# Patient Record
Sex: Female | Born: 1990 | Race: White | Hispanic: No | Marital: Single | Smoking: Current every day smoker
Health system: Southern US, Community
[De-identification: ages and names within clinical notes are randomized; demographics above are authoritative.]

## PROBLEM LIST (undated history)

## (undated) DIAGNOSIS — Z789 Other specified health status: Secondary | ICD-10-CM

## (undated) HISTORY — DX: Other specified health status: Z78.9

## (undated) HISTORY — PX: WISDOM TOOTH EXTRACTION: SHX21

---

## 2012-06-30 ENCOUNTER — Other Ambulatory Visit: Payer: Self-pay | Admitting: Specialist

## 2012-06-30 DIAGNOSIS — N63 Unspecified lump in unspecified breast: Secondary | ICD-10-CM

## 2012-07-10 ENCOUNTER — Ambulatory Visit: Payer: Managed Care, Other (non HMO) | Attending: Specialist | Admitting: Physical Therapy

## 2012-07-10 DIAGNOSIS — R293 Abnormal posture: Secondary | ICD-10-CM | POA: Insufficient documentation

## 2012-07-10 DIAGNOSIS — M545 Low back pain, unspecified: Secondary | ICD-10-CM | POA: Insufficient documentation

## 2012-07-10 DIAGNOSIS — M25659 Stiffness of unspecified hip, not elsewhere classified: Secondary | ICD-10-CM | POA: Insufficient documentation

## 2012-07-10 DIAGNOSIS — IMO0001 Reserved for inherently not codable concepts without codable children: Secondary | ICD-10-CM | POA: Insufficient documentation

## 2012-07-14 ENCOUNTER — Ambulatory Visit
Admission: RE | Admit: 2012-07-14 | Discharge: 2012-07-14 | Disposition: A | Discharge status: Home / Self Care | Payer: Managed Care, Other (non HMO) | Source: Ambulatory Visit | Attending: Specialist | Admitting: Specialist

## 2012-07-14 DIAGNOSIS — N63 Unspecified lump in unspecified breast: Secondary | ICD-10-CM

## 2012-07-16 ENCOUNTER — Encounter: Payer: Managed Care, Other (non HMO) | Admitting: Rehabilitation

## 2012-07-18 ENCOUNTER — Ambulatory Visit: Payer: Managed Care, Other (non HMO) | Admitting: Physical Therapy

## 2012-07-21 ENCOUNTER — Ambulatory Visit: Payer: Managed Care, Other (non HMO) | Admitting: Rehabilitation

## 2012-07-23 ENCOUNTER — Encounter: Payer: Managed Care, Other (non HMO) | Admitting: Physical Therapy

## 2012-08-05 ENCOUNTER — Encounter: Payer: Managed Care, Other (non HMO) | Admitting: Rehabilitation

## 2012-08-07 ENCOUNTER — Encounter: Payer: Managed Care, Other (non HMO) | Admitting: Physical Therapy

## 2012-12-06 ENCOUNTER — Emergency Department (HOSPITAL_COMMUNITY)
Admission: EM | Admit: 2012-12-06 | Discharge: 2012-12-06 | Disposition: A | Discharge status: Home / Self Care | Payer: Managed Care, Other (non HMO) | Attending: Emergency Medicine | Admitting: Emergency Medicine

## 2012-12-06 ENCOUNTER — Emergency Department (HOSPITAL_COMMUNITY): Payer: Managed Care, Other (non HMO)

## 2012-12-06 ENCOUNTER — Encounter (HOSPITAL_COMMUNITY): Payer: Self-pay | Admitting: Emergency Medicine

## 2012-12-06 DIAGNOSIS — Y92009 Unspecified place in unspecified non-institutional (private) residence as the place of occurrence of the external cause: Secondary | ICD-10-CM | POA: Insufficient documentation

## 2012-12-06 DIAGNOSIS — Y9389 Activity, other specified: Secondary | ICD-10-CM | POA: Insufficient documentation

## 2012-12-06 DIAGNOSIS — IMO0002 Reserved for concepts with insufficient information to code with codable children: Secondary | ICD-10-CM | POA: Insufficient documentation

## 2012-12-06 DIAGNOSIS — Z23 Encounter for immunization: Secondary | ICD-10-CM | POA: Insufficient documentation

## 2012-12-06 DIAGNOSIS — F17200 Nicotine dependence, unspecified, uncomplicated: Secondary | ICD-10-CM | POA: Insufficient documentation

## 2012-12-06 DIAGNOSIS — S51809A Unspecified open wound of unspecified forearm, initial encounter: Secondary | ICD-10-CM | POA: Insufficient documentation

## 2012-12-06 MED ORDER — OXYCODONE-ACETAMINOPHEN 5-325 MG PO TABS
1.0000 | ORAL_TABLET | Freq: Once | ORAL | Status: AC
  Administered 2012-12-06: 1 via ORAL
  Filled 2012-12-06: qty 1

## 2012-12-06 MED ORDER — TETANUS-DIPHTH-ACELL PERTUSSIS 5-2.5-18.5 LF-MCG/0.5 IM SUSP
0.5000 mL | Freq: Once | INTRAMUSCULAR | Status: AC
  Administered 2012-12-06: 0.5 mL via INTRAMUSCULAR
  Filled 2012-12-06: qty 0.5

## 2012-12-06 MED ORDER — NAPROXEN 500 MG PO TABS: 500.0000 mg | ORAL_TABLET | Freq: Two times a day (BID) | ORAL | Status: DC

## 2012-12-06 MED ORDER — OXYCODONE-ACETAMINOPHEN 5-325 MG PO TABS: 1.0000 | ORAL_TABLET | ORAL | Status: DC | PRN

## 2012-12-06 NOTE — ED Provider Notes (Signed)
22 year old female presents with laceration to the right hand after she punched her hand through glass. This was acute in onset just prior to arrival, the symptoms are persistent, mild to moderate, worse with range of motion. She is unsure of her last tetanus shot.  Patient has mild tenderness over the metacarpophalangeal joints of all 5 fingers except for the thumb. She has small abrasions and superficial lacerations which are fillet type injuries to the hand. None of these require repair. There is a very small laceration that does require repair which is linear and subcutaneous and located over the dorsal ulnar aspect of the distal wrist. She has normal range of motion of the wrist.  Imaging reveals no fractures or dislocations of the hand. There is a foreign body present which is looks apparent glass.  Foreign body removed, see physician assistant note for foreign body removal.  Laceration repair, tetanus updated, patient discharged home.    Medical screening examination/treatment/procedure(s) were conducted as a shared visit with non-physician practitioner(s) and myself.  I personally evaluated the patient during the encounter    Vida Roller, MD 12/06/12 [phone]

## 2012-12-06 NOTE — ED Notes (Signed)
Pt states she hit a glass window at approx 1:15 this morning.  Lacerations to the posterior wrist and puncture mark to the 5th finger

## 2012-12-06 NOTE — ED Notes (Signed)
PA at bedside to suture.

## 2012-12-06 NOTE — ED Provider Notes (Signed)
History     CSN: [id number]  Arrival date & time 12/06/12  0141   First MD Initiated Contact with Patient 12/06/12 0221      Chief Complaint  Patient presents with  . Hand Injury    (Consider location/radiation/quality/duration/timing/severity/associated sxs/prior treatment) HPI Comments: 22 y.o. Female presents today with laceration to right hand after punching a window at home around midnight tonight. Pt is unsure of last tetanus. Pt states the pain is constant with a severity of 9/10 and worse with movement. She tried to stop the bleeding at home, but could not. She took nothing for the pain. Pt states she has not been drinking tonight. Small piece of glass protruding at laceration site, dorsal lateral aspect of right distal forearm.  Patient is a 22 y.o. female presenting with hand injury.  Hand Injury  Pertinent negatives include no fever.    History reviewed. No pertinent past medical history.  History reviewed. No pertinent past surgical history.  No family history on file.  History  Substance Use Topics  . Smoking status: Current Every Day Smoker  . Smokeless tobacco: Not on file  . Alcohol Use: No    OB History    Grav Para Term Preterm Abortions TAB SAB Ect Mult Living                  Review of Systems  Constitutional: Negative for fever and diaphoresis.  HENT: Negative for neck pain and neck stiffness.   Eyes: Negative for visual disturbance.  Respiratory: Negative for apnea, chest tightness and shortness of breath.   Cardiovascular: Negative for chest pain and palpitations.  Gastrointestinal: Negative for nausea, vomiting, diarrhea and constipation.  Genitourinary: Negative for dysuria.  Musculoskeletal: Negative for gait problem.  Skin: Negative for rash.       Laceration to dorsal lateral aspect of right distal forearm  Neurological: Negative for dizziness, weakness, light-headedness, numbness and headaches.  All other systems reviewed and are  negative.    Allergies  Review of patient's allergies indicates no known allergies.  Home Medications  No current outpatient prescriptions on file.  BP 113/74  Pulse 55  Temp 97.9 F (36.6 C) (Oral)  Resp 16  SpO2 95%  LMP 11/28/2012  Physical Exam  Nursing note and vitals reviewed. Constitutional: She is oriented to person, place, and time. She appears well-developed and well-nourished. No distress.  HENT:  Head: Normocephalic and atraumatic.  Eyes: EOM are normal. Pupils are equal, round, and reactive to light.  Neck: Normal range of motion. Neck supple.  Cardiovascular: Regular rhythm, normal heart sounds and intact distal pulses.  Exam reveals no gallop and no friction rub.   No murmur heard.      Bradycardic at 55, but asymptomatic  Pulmonary/Chest: Effort normal and breath sounds normal. No respiratory distress. She has no wheezes. She has no rales. She exhibits no tenderness.  Abdominal: Soft. Bowel sounds are normal. She exhibits no distension and no mass. There is no tenderness. There is no rebound and no guarding.  Musculoskeletal: Normal range of motion. She exhibits tenderness. She exhibits no edema.       Tenderness at laceration site, dorsal lateral aspect of right distal forearm. Tendons intact. Good motor and strength.  Neurological: She is alert and oriented to person, place, and time.       Neuro/sensory to light touch intact at injury site. No focal deficits throughout.  Skin: Skin is warm and dry. She is not diaphoretic.  2 cm laceartion to dorsal lateral aspect of right distal forearm. Glass protrusion.    ED Course  FOREIGN BODY REMOVAL Date/Time: 12/06/2012 2:40 AM Performed by: Glade Nurse Authorized by: Glade Nurse Consent: Verbal consent obtained. Written consent not obtained. The procedure was performed in an emergent situation. Risks and benefits: risks, benefits and alternatives were discussed Consent given by: patient Patient  understanding: patient states understanding of the procedure being performed Imaging studies: imaging studies available Patient identity confirmed: verbally with patient and arm band Time out: Immediately prior to procedure a "time out" was called to verify the correct patient, procedure, equipment, support staff and site/side marked as required. Intake: dorsal ulnar aspect of right distal forearm. Anesthesia: local infiltration Local anesthetic: lidocaine 2% with epinephrine Anesthetic total: 3 ml Patient sedated: no Patient restrained: no Patient cooperative: yes Complexity: simple 1 objects recovered. Objects recovered: glass fragment Post-procedure assessment: foreign body removed Patient tolerance: Patient tolerated the procedure well with no immediate complications.   (including critical care time)  Labs Reviewed - No data to display Dg Hand Complete Right  12/06/2012  *RADIOLOGY REPORT*  Clinical Data: Foreign body.  Laceration.  RIGHT HAND - COMPLETE 3+ VIEW  Comparison: None.  Findings: Small glass fragment partially embedded in the soft tissues overlying the distal right fifth metacarpal.  No underlying bony abnormality.  No fracture, subluxation or dislocation.  IMPRESSION: Small radiopaque foreign body overlies the distal right fifth metacarpal.  No bony abnormality.   Original Report Authenticated By: Charlett Nose, M.D.    LACERATION REPAIR Performed by: Glade Nurse Authorized by: Glade Nurse Consent: Verbal consent obtained. Risks and benefits: risks, benefits and alternatives were discussed Consent given by: patient Patient identity confirmed: provided demographic data Prepped and Draped in normal sterile fashion Wound explored  Laceration Location: dorsal lateral aspect of right distal forearm  Laceration Length: 2 cm  No Foreign Bodies seen or palpated  Anesthesia: local infiltration  Local anesthetic: lidocaine 2% with epinephrine  Anesthetic total: 3  ml  Irrigation method: syringe Amount of cleaning: standard  Skin closure: 4-0 prolene  Number of sutures: 3  Technique: simple interrupted   Patient tolerance: Patient tolerated the procedure well with no immediate complications.  Diagnosis: laceration to dorsal ulnar aspect of distal forearm.      MDM  Xray findings show small glass fragment partially embedded in the soft tissues in the distal right fifth metacarpal. No fracture noted. Glass was removed. Laceration repair completed. Laceration showed no evidence of tendon damage. FROM of all five digits, good muscle strength, neuro and pulses in tact. Pt discharged with instructions for suture removal in 7-10 days. Tetanus shot given during ED visit. Pt educated to look for signs of infection (redness, pus-colored oozing from site, pain, fever) and to return to ED if these symptoms arise.   Glade Nurse, PA-C 12/06/12 0721  Glade Nurse, PA-C 12/08/12 [phone]

## 2012-12-06 NOTE — ED Notes (Signed)
PT. PRESENTS WITH MULTIPLE LACERATIONS AT RIGHT HAND WITH MODERATE BLEEDING , PT. REPORTS PUNCHED A GLASS WINDOW THIS EVENING , DRESSING APPLIED AT TRIAGE.

## 2012-12-11 NOTE — ED Provider Notes (Signed)
Medical screening examination/treatment/procedure(s) were conducted as a shared visit with non-physician practitioner(s) and myself.  I personally evaluated the patient during the encounter  Please see my separate respective documentation pertaining to this patient encounter   Vida Roller, MD 12/11/12 [phone]

## 2012-12-15 ENCOUNTER — Emergency Department (HOSPITAL_COMMUNITY)
Admission: EM | Admit: 2012-12-15 | Discharge: 2012-12-15 | Disposition: A | Discharge status: Home / Self Care | Payer: Managed Care, Other (non HMO) | Attending: Emergency Medicine | Admitting: Emergency Medicine

## 2012-12-15 ENCOUNTER — Encounter (HOSPITAL_COMMUNITY): Payer: Self-pay | Admitting: *Deleted

## 2012-12-15 DIAGNOSIS — F17200 Nicotine dependence, unspecified, uncomplicated: Secondary | ICD-10-CM | POA: Insufficient documentation

## 2012-12-15 DIAGNOSIS — S41119A Laceration without foreign body of unspecified upper arm, initial encounter: Secondary | ICD-10-CM

## 2012-12-15 DIAGNOSIS — Z79899 Other long term (current) drug therapy: Secondary | ICD-10-CM | POA: Insufficient documentation

## 2012-12-15 DIAGNOSIS — Z4802 Encounter for removal of sutures: Secondary | ICD-10-CM | POA: Insufficient documentation

## 2012-12-15 NOTE — ED Notes (Signed)
Pt moved from [facility] to PEDS.

## 2012-12-15 NOTE — ED Notes (Signed)
Called x1 for triage. No answer

## 2012-12-15 NOTE — ED Provider Notes (Addendum)
History     CSN: [id number]  Arrival date & time 12/15/12  1230   First MD Initiated Contact with Patient 12/15/12 1255      Chief Complaint  Patient presents with  . Suture / Staple Removal    (Consider location/radiation/quality/duration/timing/severity/associated sxs/prior treatment) Patient is a 22 y.o. female presenting with suture removal. The history is provided by the patient. No language interpreter was used.  Suture / Staple Removal  The sutures were placed 7 to 10 days ago. There has been no treatment since the wound repair. There has been no drainage from the wound. There is no redness present. There is no swelling present. The pain has no pain. She has no difficulty moving the affected extremity or digit.    History reviewed. No pertinent past medical history.  History reviewed. No pertinent past surgical history.  History reviewed. No pertinent family history.  History  Substance Use Topics  . Smoking status: Current Every Day Smoker  . Smokeless tobacco: Not on file  . Alcohol Use: No    OB History    Grav Para Term Preterm Abortions TAB SAB Ect Mult Living                  Review of Systems  All other systems reviewed and are negative.    Allergies  Review of patient's allergies indicates no known allergies.  Home Medications   Current Outpatient Rx  Name  Route  Sig  Dispense  Refill  . NAPROXEN 500 MG PO TABS   Oral   Take 1 tablet (500 mg total) by mouth 2 (two) times daily with a meal.   2 tablet   0   . OVER THE COUNTER MEDICATION   Oral   Take 2-3 tablets by mouth every 8 (eight) hours as needed. For headache (overseas brand of tylenol and caffeine)         . OXYCODONE-ACETAMINOPHEN 5-325 MG PO TABS   Oral   Take 1 tablet by mouth every 4 (four) hours as needed for pain. May take 2 tablets PO q 6 hours for severe pain - Do not take with Tylenol as this tablet already contains tylenol   15 tablet   0     BP 140/82  Pulse 90   Temp 98 F (36.7 C) (Oral)  Resp 16  SpO2 100%  LMP 11/28/2012  Physical Exam  Constitutional: She is oriented to person, place, and time. She appears well-developed and well-nourished.  HENT:  Head: Normocephalic.  Right Ear: External ear normal.  Left Ear: External ear normal.  Nose: Nose normal.  Mouth/Throat: Oropharynx is clear and moist.  Eyes: EOM are normal. Pupils are equal, round, and reactive to light. Right eye exhibits no discharge. Left eye exhibits no discharge.  Neck: Normal range of motion. Neck supple. No tracheal deviation present.       No nuchal rigidity no meningeal signs  Cardiovascular: Normal rate and regular rhythm.   Pulmonary/Chest: Effort normal and breath sounds normal. No stridor. No respiratory distress. She has no wheezes. She has no rales.  Abdominal: Soft. She exhibits no distension and no mass. There is no tenderness. There is no rebound and no guarding.  Musculoskeletal: Normal range of motion. She exhibits no edema and no tenderness.       3 sutures located medial wrist region. Neurovascularly intact distally. No induration fluctuance tenderness or drainage noted. No spreading erythema  Neurological: She is alert and oriented to person, place,  and time. She has normal reflexes. No cranial nerve deficit. Coordination normal.  Skin: Skin is warm. No rash noted. She is not diaphoretic. No erythema. No pallor.       No pettechia no purpura    ED Course  Procedures (including critical care time)  Labs Reviewed - No data to display No results found.   1. Visit for suture removal   2. Arm laceration       MDM  I reviewed the previous note and used in my decision-making process. Sutures removed per note. No evidence of infection. Patient remains neurovascularly intact distally. We'll discharge home patient agrees with plan   SUTURE REMOVAL Performed by: Arley Phenix  Consent: Verbal consent obtained. Patient identity confirmed:  provided demographic data Time out: Immediately prior to procedure a "time out" was called to verify the correct patient, procedure, equipment, support staff and site/side marked as required.  Location details: wrist  Wound Appearance: clean  Sutures/Staples Removed: 3  Facility: sutures placed in this facility Patient tolerance: Patient tolerated the procedure well with no immediate complications.          Arley Phenix, MD 12/15/12 1519  Arley Phenix, MD 12/15/12 [phone]

## 2012-12-15 NOTE — ED Notes (Signed)
Pt here for suture removal.  No swelling, redness at site.

## 2015-01-12 ENCOUNTER — Encounter (HOSPITAL_COMMUNITY): Payer: Self-pay | Admitting: *Deleted

## 2015-01-12 ENCOUNTER — Emergency Department (HOSPITAL_COMMUNITY)
Admission: EM | Admit: 2015-01-12 | Discharge: 2015-01-12 | Disposition: A | Discharge status: Home / Self Care | Payer: Managed Care, Other (non HMO) | Attending: Emergency Medicine | Admitting: Emergency Medicine

## 2015-01-12 DIAGNOSIS — O9989 Other specified diseases and conditions complicating pregnancy, childbirth and the puerperium: Secondary | ICD-10-CM | POA: Insufficient documentation

## 2015-01-12 DIAGNOSIS — Z3A09 9 weeks gestation of pregnancy: Secondary | ICD-10-CM | POA: Insufficient documentation

## 2015-01-12 DIAGNOSIS — Z791 Long term (current) use of non-steroidal anti-inflammatories (NSAID): Secondary | ICD-10-CM | POA: Insufficient documentation

## 2015-01-12 DIAGNOSIS — F17200 Nicotine dependence, unspecified, uncomplicated: Secondary | ICD-10-CM | POA: Insufficient documentation

## 2015-01-12 DIAGNOSIS — R51 Headache: Secondary | ICD-10-CM | POA: Insufficient documentation

## 2015-01-12 DIAGNOSIS — O99331 Smoking (tobacco) complicating pregnancy, first trimester: Secondary | ICD-10-CM | POA: Insufficient documentation

## 2015-01-12 DIAGNOSIS — G518 Other disorders of facial nerve: Secondary | ICD-10-CM | POA: Insufficient documentation

## 2015-01-12 NOTE — ED Provider Notes (Signed)
CSN: [id number]     Arrival date & time 01/12/15  1943 History   First MD Initiated Contact with Patient 01/12/15 2005     Chief Complaint  Patient presents with  . Facial Pain     (Consider location/radiation/quality/duration/timing/severity/associated sxs/prior Treatment) HPI Comments: The past several months.  She has intermittent pain on the right side of her face along the pathway from her ear to the corner of her eye along her right cheek and along the angle of the jaw, sometimes radiating to the neck up to the level of the collarbone.  This is a sudden onset, lasting for seconds to minutes.  She can have days where she has no pain at all and then she has days where she has pain frequently.  She takes ibuprofen with little relief.  Patient is [redacted] weeks pregnant. She reports that just prior to this pain starting.  She had a viral infection  The history is provided by the patient.    History reviewed. No pertinent past medical history. History reviewed. No pertinent past surgical history. No family history on file. History  Substance Use Topics  . Smoking status: Current Every Day Smoker  . Smokeless tobacco: Not on file  . Alcohol Use: No   OB History    No data available     Review of Systems  Constitutional: Negative for fever.  HENT: Negative for dental problem, ear pain and rhinorrhea.   Respiratory: Negative for shortness of breath.   Neurological: Negative for dizziness, weakness, numbness and headaches.  All other systems reviewed and are negative.     Allergies  Review of patient's allergies indicates no known allergies.  Home Medications   Prior to Admission medications   Medication Sig Start Date End Date Taking? Authorizing Provider  naproxen (NAPROSYN) 500 MG tablet Take 1 tablet (500 mg total) by mouth 2 (two) times daily with a meal. 12/06/12   Glade NurseBarbara Beck, PA-C  OVER THE COUNTER MEDICATION Take 2-3 tablets by mouth every 8 (eight) hours as needed. For  headache (overseas brand of tylenol and caffeine)    Historical Provider, MD  oxyCODONE-acetaminophen (PERCOCET/ROXICET) 5-325 MG per tablet Take 1 tablet by mouth every 4 (four) hours as needed for pain. May take 2 tablets PO q 6 hours for severe pain - Do not take with Tylenol as this tablet already contains tylenol 12/06/12   Glade NurseBarbara Beck, PA-C   BP 138/89 mmHg  Pulse 98  Temp(Src) 98.6 F (37 C) (Oral)  Resp 20  SpO2 100%  LMP 11/11/2014 Physical Exam  Constitutional: She is oriented to person, place, and time. She appears well-developed and well-nourished.  Eyes: Pupils are equal, round, and reactive to light.  Neck: Normal range of motion.  Cardiovascular: Normal rate and regular rhythm.   Pulmonary/Chest: Effort normal and breath sounds normal.  Abdominal: Soft.  Lymphadenopathy:    She has no cervical adenopathy.  Neurological: She is alert and oriented to person, place, and time.  Skin: Skin is warm.  Nursing note and vitals reviewed.   ED Course  Procedures (including critical care time) Labs Review Labs Reviewed - No data to display  Imaging Review No results found.   EKG Interpretation None     Referred to neuro for further evaluation  MDM   Final diagnoses:  Neuralgia of 7th cranial nerve         Arman FilterGail K Kaushik Maul, NP 01/12/15 2026  Glynn OctaveStephen Rancour, MD 01/12/15 2206

## 2015-01-12 NOTE — ED Notes (Signed)
The pt has rt face pain for several months. It is worrying her now.  lmp  Dec 5th

## 2015-01-12 NOTE — Discharge Instructions (Signed)

## 2016-09-25 ENCOUNTER — Encounter (HOSPITAL_COMMUNITY): Payer: Self-pay

## 2016-09-25 ENCOUNTER — Emergency Department (HOSPITAL_COMMUNITY)
Admission: EM | Admit: 2016-09-25 | Discharge: 2016-09-25 | Disposition: A | Discharge status: Home / Self Care | Payer: Managed Care, Other (non HMO) | Attending: Emergency Medicine | Admitting: Emergency Medicine

## 2016-09-25 ENCOUNTER — Emergency Department (HOSPITAL_COMMUNITY): Payer: Managed Care, Other (non HMO)

## 2016-09-25 DIAGNOSIS — M25461 Effusion, right knee: Secondary | ICD-10-CM | POA: Insufficient documentation

## 2016-09-25 DIAGNOSIS — Y9301 Activity, walking, marching and hiking: Secondary | ICD-10-CM | POA: Insufficient documentation

## 2016-09-25 DIAGNOSIS — W109XXA Fall (on) (from) unspecified stairs and steps, initial encounter: Secondary | ICD-10-CM | POA: Insufficient documentation

## 2016-09-25 DIAGNOSIS — Y999 Unspecified external cause status: Secondary | ICD-10-CM | POA: Insufficient documentation

## 2016-09-25 DIAGNOSIS — Y929 Unspecified place or not applicable: Secondary | ICD-10-CM | POA: Insufficient documentation

## 2016-09-25 DIAGNOSIS — F17210 Nicotine dependence, cigarettes, uncomplicated: Secondary | ICD-10-CM | POA: Insufficient documentation

## 2016-09-25 MED ORDER — TRAMADOL HCL 50 MG PO TABS
50.0000 mg | ORAL_TABLET | Freq: Once | ORAL | Status: AC
  Administered 2016-09-25: 50 mg via ORAL
  Filled 2016-09-25: qty 1

## 2016-09-25 MED ORDER — NAPROXEN 500 MG PO TABS: 500.0000 mg | ORAL_TABLET | Freq: Two times a day (BID) | ORAL | 0 refills | Status: DC

## 2016-09-25 MED ORDER — TRAMADOL HCL 50 MG PO TABS: 50.0000 mg | ORAL_TABLET | Freq: Four times a day (QID) | ORAL | 0 refills | Status: DC | PRN

## 2016-09-25 NOTE — ED Provider Notes (Signed)
[facility]-EMERGENCY DEPT Provider Note   CSN: [id number] Arrival date & time: 09/25/16  1428  By signing my name below, I, Linna Darner, attest that this documentation has been prepared under the direction and in the presence of Kerrie Buffalo, NP. Electronically Signed: Linna Darner, Scribe. 09/25/2016. 4:08 PM.  History   Chief Complaint Chief Complaint  Patient presents with  . Knee Pain    The history is provided by the patient. No language interpreter was used.  Knee Pain   This is a new problem. The current episode started more than 2 days ago. The problem occurs constantly. The problem has been gradually improving. The pain is present in the right knee. The quality of the pain is described as constant. The pain is severe. Pertinent negatives include no numbness and no tingling. The symptoms are aggravated by activity, contact and standing (and flexion). She has tried OTC pain medications, cold and rest for the symptoms. The treatment provided mild relief. There has been a history of trauma (right knee dislocation 1 year ago). Family history is significant for no rheumatoid arthritis and no gout.     HPI Comments: Kristina Dunn is a 25 y.o. female who presents to the Emergency Department complaining of sudden onset, constant, gradually improving, right knee pain beginning 3 days ago. She states she was walking downstairs and jumped off of the third step from the bottom; she states she "landed wrong" on her right knee and fell down. She denies hitting her head or losing consciousness. She endorses associated bruising and swelling. Pt endorses pain exacerbation with right knee flexion, bearing weight on her right leg, and palpation to her right knee. Pt states she has applied ice to her right knee and taken ibuprofen with minimal relief of her symptoms. She notes she dislocated her right knee about a year ago but was never evaluated by a health care provider. Pt is currently unemployed. She  denies numbness, right ankle pain, wounds, or any other associated symptoms.  History reviewed. No pertinent past medical history.  There are no active problems to display for this patient.   History reviewed. No pertinent surgical history.  OB History    No data available       Home Medications    Prior to Admission medications   Medication Sig Start Date End Date Taking? Authorizing Provider  naproxen (NAPROSYN) 500 MG tablet Take 1 tablet (500 mg total) by mouth 2 (two) times daily. 09/25/16   Zenola Dezarn Orlene Och, NP  OVER THE COUNTER MEDICATION Take 2-3 tablets by mouth every 8 (eight) hours as needed. For headache (overseas brand of tylenol and caffeine)    Historical Provider, MD  oxyCODONE-acetaminophen (PERCOCET/ROXICET) 5-325 MG per tablet Take 1 tablet by mouth every 4 (four) hours as needed for pain. May take 2 tablets PO q 6 hours for severe pain - Do not take with Tylenol as this tablet already contains tylenol 12/06/12   Lowell Bouton, PA-C  traMADol (ULTRAM) 50 MG tablet Take 1 tablet (50 mg total) by mouth every 6 (six) hours as needed. 09/25/16   Alisea Matte Orlene Och, NP    Family History History reviewed. No pertinent family history.  Social History Social History  Substance Use Topics  . Smoking status: Current Every Day Smoker    Packs/day: 1.00    Types: Cigarettes  . Smokeless tobacco: Never Used  . Alcohol use No     Comment: social     Allergies   Review of  patient's allergies indicates no known allergies.   Review of Systems Review of Systems  Musculoskeletal: Positive for arthralgias (right knee) and joint swelling (right knee).  Skin: Positive for color change. Negative for wound.  Neurological: Negative for tingling, syncope and numbness.  All other systems reviewed and are negative.   Physical Exam Updated Vital Signs BP 138/92 (BP Location: Right Arm)   Pulse 104   Temp 98.1 F (36.7 C) (Oral)   Ht 5\' 9"  (1.753 m)   Wt 63.5 kg   LMP  09/22/2016 (Exact Date)   SpO2 100%   BMI 20.67 kg/m   Physical Exam  Constitutional: She is oriented to person, place, and time. She appears well-developed and well-nourished. No distress.  HENT:  Head: Normocephalic and atraumatic.  Eyes: Conjunctivae and EOM are normal.  Neck: Neck supple. No tracheal deviation present.  Cardiovascular: Normal rate.   Pulmonary/Chest: Effort normal. No respiratory distress.  Musculoskeletal: Normal range of motion.  Right knee: adequate circulation. DP, pedal, and popliteal pulse 2+. Pain with any ROM, increased pain with flexion. Swelling and effusion noted to the anterior aspect.  Neurological: She is alert and oriented to person, place, and time.  Skin: Skin is warm and dry.  Psychiatric: She has a normal mood and affect. Her behavior is normal.  Nursing note and vitals reviewed.   ED Treatments / Results  Labs (all labs ordered are listed, but only abnormal results are displayed) Labs Reviewed - No data to display  Radiology Dg Knee Complete 4 Views Right  Result Date: 09/25/2016 CLINICAL DATA:  Pain following fall EXAM: RIGHT KNEE - COMPLETE 4+ VIEW COMPARISON:  None. FINDINGS: Frontal, lateral, and bilateral oblique views were obtained. There is no fracture or dislocation. There is a small joint effusion. The joint spaces appear normal. No erosive change. IMPRESSION: Joint effusion. No fracture or dislocation. No appreciable arthropathy. Electronically Signed   By: Bretta BangWilliam  Woodruff III M.D.   On: 09/25/2016 15:23    Procedures Procedures (including critical care time)  DIAGNOSTIC STUDIES: Oxygen Saturation is 100% on RA, normal by my interpretation.    COORDINATION OF CARE: 4:16 PM Discussed treatment plan with pt at bedside and pt agreed to plan.  Medications Ordered in ED Medications  traMADol (ULTRAM) tablet 50 mg (50 mg Oral Given 09/25/16 1645)     Initial Impression / Assessment and Plan / ED Course  I have reviewed  the triage vital signs and the nursing notes.  Pertinent imaging results that were available during my care of the patient were reviewed by me and considered in my medical decision making (see chart for details).  Clinical Course  25 y.o. female with right knee pain s/p injury 3 days ago stable for d/c without focal neuro deficits. Knee brace applied, patient offered crutches but declined. Discussed with patient possible ligament injury and need for elevation, ice, pain management and f/u with ortho. Patient voices understanding and agrees with plan.  I personally performed the services described in this documentation, which was scribed in my presence. The recorded information has been reviewed and is accurate.   Final Clinical Impressions(s) / ED Diagnoses   Final diagnoses:  Effusion of right knee    New Prescriptions New Prescriptions   NAPROXEN (NAPROSYN) 500 MG TABLET    Take 1 tablet (500 mg total) by mouth 2 (two) times daily.   TRAMADOL (ULTRAM) 50 MG TABLET    Take 1 tablet (50 mg total) by mouth every 6 (six) hours  as needed.     [address] GardensM Mahika Vanvoorhis, NP 09/25/16 1655    Nelva Nayobert Beaton, MD 10/06/16 [phone]

## 2016-09-25 NOTE — ED Triage Notes (Addendum)
Pt reports she jumped off 3 steps Saturday and landed wrong causing injury to her right knee. She has swelling to the leg and reports bruising. Pt reports hx of dislocation. Faint pedal pulse noted, cap refill <3 seconds.

## 2016-09-25 NOTE — ED Notes (Signed)
Pt st's she does not need the crutches.  St's she knows she won't use them

## 2016-09-25 NOTE — Discharge Instructions (Signed)
Do not drive while taking the narcotic as it will make you sleepy. Follow up with the orthopedic doctor. Return here as needed.  °

## 2017-12-07 ENCOUNTER — Other Ambulatory Visit: Payer: Self-pay

## 2017-12-07 ENCOUNTER — Emergency Department (HOSPITAL_COMMUNITY)
Admission: EM | Admit: 2017-12-07 | Discharge: 2017-12-08 | Disposition: A | Discharge status: Home / Self Care | Payer: Self-pay | Attending: Emergency Medicine | Admitting: Emergency Medicine

## 2017-12-07 ENCOUNTER — Emergency Department (HOSPITAL_COMMUNITY): Payer: Self-pay

## 2017-12-07 ENCOUNTER — Encounter (HOSPITAL_COMMUNITY): Payer: Self-pay

## 2017-12-07 DIAGNOSIS — J189 Pneumonia, unspecified organism: Secondary | ICD-10-CM | POA: Insufficient documentation

## 2017-12-07 DIAGNOSIS — F17210 Nicotine dependence, cigarettes, uncomplicated: Secondary | ICD-10-CM | POA: Insufficient documentation

## 2017-12-07 DIAGNOSIS — J181 Lobar pneumonia, unspecified organism: Secondary | ICD-10-CM

## 2017-12-07 LAB — URINALYSIS, ROUTINE W REFLEX MICROSCOPIC
BILIRUBIN URINE: NEGATIVE
Glucose, UA: NEGATIVE mg/dL
HGB URINE DIPSTICK: NEGATIVE
Ketones, ur: 5 mg/dL — AB
Leukocytes, UA: NEGATIVE
Nitrite: NEGATIVE
PROTEIN: NEGATIVE mg/dL
Specific Gravity, Urine: 1.017 (ref 1.005–1.030)
pH: 6 (ref 5.0–8.0)

## 2017-12-07 LAB — CBC WITH DIFFERENTIAL/PLATELET
Basophils Absolute: 0 10*3/uL (ref 0.0–0.1)
Basophils Relative: 0 %
EOS ABS: 0.1 10*3/uL (ref 0.0–0.7)
EOS PCT: 0 %
HCT: 39 % (ref 36.0–46.0)
Hemoglobin: 13.7 g/dL (ref 12.0–15.0)
LYMPHS ABS: 1.5 10*3/uL (ref 0.7–4.0)
Lymphocytes Relative: 9 %
MCH: 32.3 pg (ref 26.0–34.0)
MCHC: 35.1 g/dL (ref 30.0–36.0)
MCV: 92 fL (ref 78.0–100.0)
MONOS PCT: 5 %
Monocytes Absolute: 0.8 10*3/uL (ref 0.1–1.0)
Neutro Abs: 13.4 10*3/uL — ABNORMAL HIGH (ref 1.7–7.7)
Neutrophils Relative %: 86 %
PLATELETS: 207 10*3/uL (ref 150–400)
RBC: 4.24 MIL/uL (ref 3.87–5.11)
RDW: 12.9 % (ref 11.5–15.5)
WBC: 15.8 10*3/uL — ABNORMAL HIGH (ref 4.0–10.5)

## 2017-12-07 LAB — COMPREHENSIVE METABOLIC PANEL
ALK PHOS: 45 U/L (ref 38–126)
ALT: 16 U/L (ref 14–54)
AST: 16 U/L (ref 15–41)
Albumin: 3.8 g/dL (ref 3.5–5.0)
Anion gap: 11 (ref 5–15)
BUN: 7 mg/dL (ref 6–20)
CHLORIDE: 105 mmol/L (ref 101–111)
CO2: 20 mmol/L — AB (ref 22–32)
CREATININE: 0.85 mg/dL (ref 0.44–1.00)
Calcium: 8.9 mg/dL (ref 8.9–10.3)
GFR calc Af Amer: >60 mL/min (ref >60)
GFR calc non Af Amer: >60 mL/min (ref >60)
Glucose, Bld: 116 mg/dL — ABNORMAL HIGH (ref 65–99)
Potassium: 3.7 mmol/L (ref 3.5–5.1)
Sodium: 136 mmol/L (ref 135–145)
Total Bilirubin: 0.8 mg/dL (ref 0.3–1.2)
Total Protein: 6.9 g/dL (ref 6.5–8.1)

## 2017-12-07 LAB — I-STAT BETA HCG BLOOD, ED (MC, WL, AP ONLY)

## 2017-12-07 LAB — I-STAT CG4 LACTIC ACID, ED: LACTIC ACID, VENOUS: 0.63 mmol/L (ref 0.5–1.9)

## 2017-12-07 MED ORDER — KETOROLAC TROMETHAMINE 30 MG/ML IJ SOLN
30.0000 mg | Freq: Once | INTRAMUSCULAR | Status: AC
  Administered 2017-12-07: 30 mg via INTRAVENOUS
  Filled 2017-12-07: qty 1

## 2017-12-07 MED ORDER — DEXTROSE 5 % IV SOLN
1.0000 g | Freq: Once | INTRAVENOUS | Status: AC
  Administered 2017-12-07: 1 g via INTRAVENOUS
  Filled 2017-12-07: qty 10

## 2017-12-07 MED ORDER — IBUPROFEN 400 MG PO TABS
400.0000 mg | ORAL_TABLET | Freq: Once | ORAL | Status: AC
  Administered 2017-12-07: 400 mg via ORAL
  Filled 2017-12-07: qty 1

## 2017-12-07 MED ORDER — SODIUM CHLORIDE 0.9 % IV BOLUS (SEPSIS)
1000.0000 mL | Freq: Once | INTRAVENOUS | Status: AC
  Administered 2017-12-07: 1000 mL via INTRAVENOUS

## 2017-12-07 MED ORDER — DEXTROSE 5 % IV SOLN
500.0000 mg | Freq: Once | INTRAVENOUS | Status: AC
  Administered 2017-12-07: 500 mg via INTRAVENOUS
  Filled 2017-12-07: qty 500

## 2017-12-07 NOTE — ED Notes (Signed)
CODE SEPSIS ACTIVATED RN JESSICA AWARE

## 2017-12-07 NOTE — ED Triage Notes (Signed)
Onset last night pain to "skin all over" and lower back pain radiating down bilateral legs, numbness to right lower leg.  Pain is causing "shaking'.  Onset 4 days ago non productive cough.  Pt has not taken any ibuprofen today.  Pt reports she cannot take Tylenol.

## 2017-12-07 NOTE — ED Provider Notes (Signed)
[facility] Provider Note   CSN: [id number] Arrival date & time: 12/07/17  2057     History   Chief Complaint Chief Complaint  Patient presents with  . Generalized Body Aches    HPI Kristina Dunn is a 27 y.o. female.  Patient reports 3-day history of cough productive of clear mucus.  Over the past 2 days she developed a "skin pain all over" that is worse with palpation and movement.  She also has aching pain in her low back and her bilateral hips that radiates down her legs.  Did not check her temperature at home but is febrile on arrival.  Good p.o. intake and urine output.  No vomiting.  No diarrhea.  Has had sick contacts at home.  Did not receive flu shot.  Denies any rash or recent injections.  No recent travel.  No bowel or bladder incontinence.  No focal weakness, numbness or tingling.   The history is provided by the patient.    History reviewed. No pertinent past medical history.  There are no active problems to display for this patient.   History reviewed. No pertinent surgical history.  OB History    No data available       Home Medications    Prior to Admission medications   Medication Sig Start Date End Date Taking? Authorizing Provider  naproxen (NAPROSYN) 500 MG tablet Take 1 tablet (500 mg total) by mouth 2 (two) times daily. 09/25/16   Janne NapoleonNeese, Hope M, NP  OVER THE COUNTER MEDICATION Take 2-3 tablets by mouth every 8 (eight) hours as needed. For headache (overseas brand of tylenol and caffeine)    [provider]  oxyCODONE-acetaminophen (PERCOCET/ROXICET) 5-325 MG per tablet Take 1 tablet by mouth every 4 (four) hours as needed for pain. May take 2 tablets PO q 6 hours for severe pain - Do not take with Tylenol as this tablet already contains tylenol 12/06/12   Glade NurseBeck, Barbara A, PA-C  traMADol (ULTRAM) 50 MG tablet Take 1 tablet (50 mg total) by mouth every 6 (six) hours as needed. 09/25/16   Janne NapoleonNeese, Hope M, NP      Family History History reviewed. No pertinent family history.  Social History Social History   Tobacco Use  . Smoking status: Current Every Day Smoker    Packs/day: 1.00    Types: Cigarettes  . Smokeless tobacco: Never Used  Substance Use Topics  . Alcohol use: Yes    Comment: rarely   . Drug use: Yes    Types: Marijuana    Comment: occasional     Allergies   Patient has no known allergies.   Review of Systems Review of Systems  Constitutional: Positive for fever. Negative for activity change and appetite change.  HENT: Positive for congestion. Negative for rhinorrhea.   Eyes: Negative for visual disturbance.  Respiratory: Positive for cough. Negative for chest tightness and shortness of breath.   Cardiovascular: Negative for chest pain.  Gastrointestinal: Negative for abdominal pain, nausea and vomiting.  Genitourinary: Negative for dysuria, hematuria and vaginal discharge.  Musculoskeletal: Positive for arthralgias and myalgias.  Neurological: Positive for weakness. Negative for dizziness and numbness.   all other systems are negative except as noted in the HPI and PMH.     Physical Exam Updated Vital Signs BP (!) 128/107   Pulse (!) 137   Temp (!) 101.9 F (38.8 C)   Resp 18   LMP 12/07/2017 (Within Weeks) Comment: Pt states she  has another 2 weeks on pill pack.  Did have period on last BC cycle.  SpO2 99%   Physical Exam  Constitutional: She is oriented to person, place, and time. She appears well-developed and well-nourished. No distress.  Ill appearing  HENT:  Head: Normocephalic and atraumatic.  Mouth/Throat: Oropharynx is clear and moist. No oropharyngeal exudate.  Eyes: Conjunctivae and EOM are normal. Pupils are equal, round, and reactive to light.  Neck: Normal range of motion. Neck supple.  No meningismus.  Cardiovascular: Normal rate, normal heart sounds and intact distal pulses.  No murmur heard. Tachycardic 130s  Pulmonary/Chest:  Effort normal and breath sounds normal. No respiratory distress. She exhibits no tenderness.  Crackles R base  Abdominal: Soft. There is no tenderness. There is no rebound and no guarding.  Musculoskeletal: Normal range of motion. She exhibits tenderness. She exhibits no edema.  Bilateral paraspinal lumbar tenderness, no midline tenderness 5/5 strength in bilateral lower extremities. Ankle plantar and dorsiflexion intact. Great toe extension intact bilaterally. +2 DP and PT pulses. +2 patellar reflexes bilaterally. Normal gait.   Neurological: She is alert and oriented to person, place, and time. No cranial nerve deficit. She exhibits normal muscle tone. Coordination normal.   5/5 strength throughout. CN 2-12 intact.Equal grip strength.   Skin: Skin is warm.  Psychiatric: She has a normal mood and affect. Her behavior is normal.  Nursing note and vitals reviewed.    ED Treatments / Results  Labs (all labs ordered are listed, but only abnormal results are displayed) Labs Reviewed  COMPREHENSIVE METABOLIC PANEL - Abnormal; Notable for the following components:      Result Value   CO2 20 (*)    Glucose, Bld 116 (*)    All other components within normal limits  CBC WITH DIFFERENTIAL/PLATELET - Abnormal; Notable for the following components:   WBC 15.8 (*)    Neutro Abs 13.4 (*)    All other components within normal limits  URINALYSIS, ROUTINE W REFLEX MICROSCOPIC - Abnormal; Notable for the following components:   Ketones, ur 5 (*)    All other components within normal limits  CULTURE, BLOOD (ROUTINE X 2)  CULTURE, BLOOD (ROUTINE X 2)  INFLUENZA PANEL BY PCR (TYPE A & B)  I-STAT CG4 LACTIC ACID, ED  I-STAT BETA HCG BLOOD, ED ([facility], [facility], [facility] ONLY)  I-STAT CG4 LACTIC ACID, ED    EKG  EKG Interpretation None       Radiology Dg Chest 2 View  Result Date: 12/07/2017 CLINICAL DATA:  Intermittent chest pain and cough for 4 days. EXAM: CHEST  2 VIEW COMPARISON:  None. FINDINGS: The  heart size and mediastinal contours are within normal limits. There is patchy consolidation of the medial right lung base. There is no pulmonary edema or pleural effusion. The visualized skeletal structures are unremarkable. IMPRESSION: Right medial lung base pneumonia. Electronically Signed   By: Sherian Rein M.D.   On: 12/07/2017 21:42    Procedures Procedures (including critical care time)  Medications Ordered in ED Medications  sodium chloride 0.9 % bolus 1,000 mL (1,000 mLs Intravenous New Bag/Given 12/07/17 2312)    And  sodium chloride 0.9 % bolus 1,000 mL (not administered)  cefTRIAXone (ROCEPHIN) 1 g in dextrose 5 % 50 mL IVPB (not administered)  azithromycin (ZITHROMAX) 500 mg in dextrose 5 % 250 mL IVPB (not administered)  ibuprofen (ADVIL,MOTRIN) tablet 400 mg (400 mg Oral Given 12/07/17 2123)     Initial Impression / Assessment and Plan /  ED Course  I have reviewed the triage vital signs and the nursing notes.  Pertinent labs & imaging results that were available during my care of the patient were reviewed by me and considered in my medical decision making (see chart for details).    Patient with "skin pain all over associated with cough, body aches, low back pain that radiates down her legs.  No focal neurological deficits.  Intact distal pulses.  Patient is febrile and tachycardic on arrival.  She will be treated with hydration, antipyretics.  X-ray is concerning for right basilar pneumonia.  Will treat.  Sepsis protocol initiated.  Patient improved after IV fluids and IV antibiotics.  Heart rate has improved to the 90s.  She is amatory without desaturation.  She is tolerating p.o.  We will treat for community acquired pneumonia.  Follow-up with PCP.  Return precautions discussed. Final Clinical Impressions(s) / ED Diagnoses   Final diagnoses:  Community acquired pneumonia of right middle lobe of lung [facility])    ED Discharge Orders    None       Onofre Gains, Jeannett Senior,  MD 12/08/17 0425

## 2017-12-08 LAB — INFLUENZA PANEL BY PCR (TYPE A & B)
INFLBPCR: NEGATIVE
Influenza A By PCR: NEGATIVE

## 2017-12-08 LAB — CK: Total CK: 63 U/L (ref 38–234)

## 2017-12-08 MED ORDER — AMOXICILLIN 500 MG PO CAPS: 500.0000 mg | ORAL_CAPSULE | Freq: Three times a day (TID) | ORAL | 0 refills | Status: DC

## 2017-12-08 MED ORDER — DOXYCYCLINE HYCLATE 100 MG PO CAPS: 100.0000 mg | ORAL_CAPSULE | Freq: Two times a day (BID) | ORAL | 0 refills | Status: DC

## 2017-12-08 MED ORDER — DEXTROSE 5 % IV SOLN: 1.0000 g | INTRAVENOUS | Status: DC

## 2017-12-08 MED ORDER — DEXTROSE 5 % IV SOLN: 500.0000 mg | INTRAVENOUS | Status: DC

## 2017-12-08 NOTE — Discharge Instructions (Signed)
Take antibiotics as prescribed.  Use ibuprofen as needed for fever and keep yourself hydrated.  Establish care with a primary care doctor.  Return to the ED if you develop difficulty breathing, not able to tolerate her medicines or any other concerns.

## 2017-12-08 NOTE — ED Notes (Signed)
Ambulated pt. In hallway, spo2 100% continuously

## 2017-12-08 NOTE — ED Notes (Signed)
Label sent to main lab for CK blood test.

## 2017-12-13 LAB — CULTURE, BLOOD (ROUTINE X 2)
CULTURE: NO GROWTH
Culture: NO GROWTH
SPECIAL REQUESTS: ADEQUATE
Special Requests: ADEQUATE

## 2018-11-26 NOTE — L&D Delivery Note (Signed)
Patient: Nickia Boesen MRN: [id number]  GBS status: Postive, IAP given: Ampicillin   Patient is a 28 y.o. now [id number] s/p NSVD at [redacted]w[redacted]d, who was admitted for PTL. SROM 25h 32m prior to delivery with clear fluid.    Delivery Note At 9:49 PM a viable female was delivered via Vaginal, Spontaneous (Presentation: LOA).  APGAR: 8, 9; weight 5 lb 9.2 oz (2530 g).   Placenta status: spontaneous, intact.  Cord: 3 vessel with the following complications: none.   Head delivered LOA. No nuchal cord present. Shoulder and body delivered in usual fashion. Infant with spontaneous cry, placed on mother's abdomen, dried and bulb suctioned. Cord clamped x 2 after 1-minute delay, and cut by delivering clinician. Baby taken to warmer to awaiting NICU team. Cord blood drawn. Placenta delivered spontaneously with gentle cord traction. Fundus firm with massage and Pitocin. Perineum inspected and found to have no lacerations.   Anesthesia:  None  Episiotomy:  None  Lacerations:  None  Suture Repair: N/A Est. Blood Loss (mL):  34   Mom to postpartum. Mom to be NPO at [facility] for PP BTL. Baby to NICU.  Melina Schools 06/15/2019, 10:16 PM

## 2019-05-18 ENCOUNTER — Telehealth (INDEPENDENT_AMBULATORY_CARE_PROVIDER_SITE_OTHER): Payer: Medicaid Other | Admitting: General Practice

## 2019-05-18 ENCOUNTER — Other Ambulatory Visit: Payer: Self-pay

## 2019-05-18 DIAGNOSIS — Z3493 Encounter for supervision of normal pregnancy, unspecified, third trimester: Secondary | ICD-10-CM

## 2019-05-18 DIAGNOSIS — O0930 Supervision of pregnancy with insufficient antenatal care, unspecified trimester: Secondary | ICD-10-CM | POA: Insufficient documentation

## 2019-05-18 DIAGNOSIS — Z3490 Encounter for supervision of normal pregnancy, unspecified, unspecified trimester: Secondary | ICD-10-CM | POA: Insufficient documentation

## 2019-05-18 DIAGNOSIS — O0933 Supervision of pregnancy with insufficient antenatal care, third trimester: Secondary | ICD-10-CM

## 2019-05-18 MED ORDER — AMBULATORY NON FORMULARY MEDICATION: 1.0000 | Freq: Once | 0 refills | Status: AC
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
## 2019-05-19 NOTE — Progress Notes (Signed)
I have reviewed the chart and agree with nursing staff's documentation of this patient's encounter.  Kerry Hough, PA-C 05/19/2019 8:14 AM

## 2019-05-26 ENCOUNTER — Encounter (HOSPITAL_COMMUNITY): Payer: Self-pay | Admitting: *Deleted

## 2019-05-26 ENCOUNTER — Encounter: Payer: Self-pay | Admitting: Medical

## 2019-05-26 ENCOUNTER — Ambulatory Visit (HOSPITAL_COMMUNITY)
Admission: RE | Admit: 2019-05-26 | Discharge: 2019-05-26 | Disposition: A | Discharge status: Home / Self Care | Payer: Medicaid Other | Source: Ambulatory Visit | Attending: Medical | Admitting: Medical

## 2019-05-26 ENCOUNTER — Ambulatory Visit (HOSPITAL_COMMUNITY): Payer: Medicaid Other | Admitting: *Deleted

## 2019-05-26 ENCOUNTER — Other Ambulatory Visit: Payer: Medicaid Other

## 2019-05-26 ENCOUNTER — Other Ambulatory Visit (HOSPITAL_COMMUNITY): Payer: Self-pay | Admitting: *Deleted

## 2019-05-26 ENCOUNTER — Other Ambulatory Visit: Payer: Self-pay

## 2019-05-26 ENCOUNTER — Other Ambulatory Visit (HOSPITAL_COMMUNITY)
Admission: RE | Admit: 2019-05-26 | Discharge: 2019-05-26 | Disposition: A | Discharge status: Home / Self Care | Payer: Medicaid Other | Source: Ambulatory Visit | Attending: Family Medicine | Admitting: Family Medicine

## 2019-05-26 ENCOUNTER — Ambulatory Visit (INDEPENDENT_AMBULATORY_CARE_PROVIDER_SITE_OTHER): Payer: Self-pay | Admitting: Clinical

## 2019-05-26 ENCOUNTER — Ambulatory Visit (INDEPENDENT_AMBULATORY_CARE_PROVIDER_SITE_OTHER): Payer: Medicaid Other | Admitting: Medical

## 2019-05-26 DIAGNOSIS — Z3493 Encounter for supervision of normal pregnancy, unspecified, third trimester: Secondary | ICD-10-CM | POA: Diagnosis not present

## 2019-05-26 DIAGNOSIS — O0933 Supervision of pregnancy with insufficient antenatal care, third trimester: Secondary | ICD-10-CM | POA: Insufficient documentation

## 2019-05-26 DIAGNOSIS — Z3A00 Weeks of gestation of pregnancy not specified: Secondary | ICD-10-CM | POA: Insufficient documentation

## 2019-05-26 DIAGNOSIS — Z23 Encounter for immunization: Secondary | ICD-10-CM | POA: Diagnosis not present

## 2019-05-26 DIAGNOSIS — Z3A31 31 weeks gestation of pregnancy: Secondary | ICD-10-CM

## 2019-05-26 DIAGNOSIS — Z658 Other specified problems related to psychosocial circumstances: Secondary | ICD-10-CM

## 2019-05-26 DIAGNOSIS — Z3689 Encounter for other specified antenatal screening: Secondary | ICD-10-CM

## 2019-05-26 LAB — POCT PREGNANCY, URINE: Preg Test, Ur: POSITIVE — AB

## 2019-05-26 NOTE — Progress Notes (Signed)
BP Cuff teaching completed-05/26/2019/[facility]-[id number]
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
## 2019-05-26 NOTE — Patient Instructions (Signed)
Childbirth Education Options: [facility] Classes:  Childbirth education classes can help you get ready for a positive parenting experience. You can also meet other expectant parents and get free stuff for your baby. Each class runs for five weeks on the same night and costs $45 for the mother-to-be and her support person. Medicaid covers the cost if you are eligible. Call [phone] to register. [facility] Childbirth Education:  [phone] or [phone] or [email or web address]  Baby & Me Class: Discuss newborn & infant parenting and family adjustment issues with other new mothers in a relaxed environment. Each week brings a new speaker or baby-centered activity. We encourage new mothers to join us every Thursday at 11:00am. Babies birth until crawling. No registration or fee. Daddy Boot Camp: This course offers Dads-to-be the tools and knowledge needed to feel confident on their journey to becoming new fathers. Experienced dads, who have been trained as coaches, teach dads-to-be how to hold, comfort, diaper, swaddle and play with their infant while being able to support the new mom as well. A class for men taught by men. $25/dad Big Brother/Big Sister: Let your children share in the joy of a new brother or sister in this special class designed just for them. Class includes discussion about how families care for babies: swaddling, holding, diapering, safety as well as how they can be helpful in their new role. This class is designed for children ages 2 to 6, but any age is welcome. Please register each child individually. $5/child  Mom Talk: This mom-led group offers support and connection to mothers as they journey through the adjustments and struggles of that sometimes overwhelming first year after the birth of a child. Tuesdays at 10:00am and Thursdays at 6:00pm. Babies welcome. No registration or fee. Breastfeeding Support Group: This group is a mother-to-mother  support circle where moms have the opportunity to share their breastfeeding experiences. A Lactation Consultant is present for questions and concerns. Meets each Tuesday at 11:00am. No fee or registration. Breastfeeding Your Baby: Learn what to expect in the first days of breastfeeding your newborn.  This class will help you feel more confident with the skills needed to begin your breastfeeding experience. Many new mothers are concerned about breastfeeding after leaving the hospital. This class will also address the most common fears and challenges about breastfeeding during the first few weeks, months and beyond. (call for fee) Comfort Techniques and Tour: This 2 hour interactive class will provide you the opportunity to learn & practice hands-on techniques that can help relieve some of the discomfort of labor and encourage your baby to rotate toward the best position for birth. You and your partner will be able to try a variety of labor positions with birth balls and rebozos as well as practice breathing, relaxation, and visualization techniques. A tour of the [facility] is included with this class. $20 per registrant and support person Childbirth Class- Weekend Option: This class is a Weekend version of our Birth & Baby series. It is designed for parents who have a difficult time fitting several weeks of classes into their schedule. It covers the care of your newborn and the basics of labor and childbirth. It also includes a Maternity Care Center Tour of [facility] and lunch. The class is held two consecutive days: beginning on Friday evening from 6:30 - 8:30 p.m. and the next day, Saturday from 9 a.m. - 4 p.m. (call for fee) Waterbirth Class: Interested in a waterbirth?  This   informational class will help you discover whether waterbirth is the right fit for you. Education about waterbirth itself, supplies you would need and how to assemble your support team is what you can  expect from this class. Some obstetrical practices require this class in order to pursue a waterbirth. (Not all obstetrical practices offer waterbirth-check with your healthcare provider.) Register only the expectant mom, but you are encouraged to bring your partner to class! Required if planning waterbirth, no fee. Infant/Child CPR: Parents, grandparents, babysitters, and friends learn Cardio-Pulmonary Resuscitation skills for infants and children. You will also learn how to treat both conscious and unconscious choking in infants and children. This Family & Friends program does not offer certification. Register each participant individually to ensure that enough mannequins are available. (Call for fee) Grandparent Love: Expecting a grandbaby? This class is for you! Learn about the latest infant care and safety recommendations and ways to support your own child as he or she transitions into the parenting role. Taught by Registered Nurses who are childbirth instructors, but most importantly...they are grandmothers too! $10/person. Childbirth Class- Natural Childbirth: This series of 5 weekly classes is for expectant parents who want to learn and practice natural methods of coping with the process of labor and childbirth. Relaxation, breathing, massage, visualization, role of the partner, and helpful positioning are highlighted. Participants learn how to be confident in their body's ability to give birth. This class will empower and help parents make informed decisions about their own care. Includes discussion that will help new parents transition into the immediate postpartum period. Maternity Care Center Tour of [facility] is included. We suggest taking this class between 25-32 weeks, but it's only a recommendation. $75 per registrant and one support person or $30 Medicaid. Childbirth Class- 3 week Series: This option of 3 weekly classes helps you and your labor partner prepare for childbirth. Newborn  care, labor & birth, cesarean birth, pain management, and comfort techniques are discussed and a Maternity Care Center Tour of [facility] is included. The class meets at the same time, on the same day of the week for 3 consecutive weeks beginning with the starting date you choose. $60 for registrant and one support person.  Marvelous Multiples: Expecting twins, triplets, or more? This class covers the differences in labor, birth, parenting, and breastfeeding issues that face multiples' parents. NICU tour is included. Led by a Certified Childbirth Educator who is the mother of twins. No fee. Caring for Baby: This class is for expectant and adoptive parents who want to learn and practice the most up-to-date newborn care for their babies. Focus is on birth through the first six weeks of life. Topics include feeding, bathing, diapering, crying, umbilical cord care, circumcision care and safe sleep. Parents learn to recognize symptoms of illness and when to call the pediatrician. Register only the mom-to-be and your partner or support person can plan to come with you! $10 per registrant and support person Childbirth Class- online option: This online class offers you the freedom to complete a Birth and Baby series in the comfort of your own home. The flexibility of this option allows you to review sections at your own pace, at times convenient to you and your support people. It includes additional video information, animations, quizzes, and extended activities. Get organized with helpful eClass tools, checklists, and trackers. Once you register online for the class, you will receive an email within a few days to accept the invitation and begin the class when the time   is right for you. The content will be available to you for 60 days. $60 for 60 days of online access for you and your support people.  Local Doulas: Natural Baby Doulas [email or web address] Tel:  [phone] [email or web address] [facility] [phone] [email or web address] The [employer] Ladies  (also do waterbirth tub rental) [phone] [email or web address] [email or web address] [phone] [email or web address] Sacred [employer]hyth[employer]  [phone] [email or web address] [place] [employer] (PADA) [email or web address].htm [place] [facility]  [email or web address] Considering Waterbirth? Guide for patients at [facility]  Why consider waterbirth?  . Gentle birth for babies . Less pain medicine used in labor . May allow for passive descent/less pushing . May reduce perineal tears  . More mobility and instinctive maternal position changes . Increased maternal relaxation . Reduced blood pressure in labor  Is waterbirth safe? What are the risks of infection, drowning or other complications?  . Infection: o Very low risk (3.7 % for tub vs 4.8% for bed) o 7 in 8000 waterbirths with documented infection o Poorly cleaned equipment most common cause o Slightly lower group B strep transmission rate  . Drowning o Maternal:  - Very low risk   - Related to seizures or fainting o Newborn:  - Very low risk. No evidence of increased risk of respiratory problems in multiple large studies - Physiological protection from breathing under water - Avoid underwater birth if there are any fetal complications - Once baby's head is out of the water, keep it out.  . Birth complication o Some reports of cord trauma, but risk decreased by bringing baby to surface gradually o No evidence of increased risk of shoulder dystocia. Mothers can usually change positions faster in water than in a bed, possibly aiding the maneuvers to free the shoulder.   You must attend a Waterbirth class at [facility]  3rd Wednesday of every month from 7-9pm  Free  Register by calling [phone] or online at [email or web address]  Bring us the certificate from the class to your prenatal appointment  Meet with a midwife at 36 weeks to see if you can still plan a waterbirth and to sign the consent.   Purchase or rent the following supplies:   Water Birth Pool (Birth Pool in a Box or [place] for instance)  (Tubs start ~$125)  Single-use disposable tub liner designed for your brand of tub  New garden hose labeled "lead-free", "suitable for drinking water",  Electric drain pump to remove water (We recommend 792 gallon per hour or greater pump.)   Separate garden hose to remove the dirty water  Fish net  Bathing suit top (optional)  Long-handled mirror (optional)  Places to purchase or rent supplies  [email or web address] for tub purchases and supplies  [email or web address] for tub purchases and supplies  The [employer] ([email or web address]) $275 for tub rental/set-up & take down/kit   [place] [employer] (http://www.padanc.org/MeetUs.htm) Information regarding doulas (labor support) who provide pool rentals  Our practice has a Birth Pool in a Box tub at the hospital that you may borrow on a first-come-first-served basis. It is your responsibility to to set up, clean and break down the tub. We cannot guarantee the availability of this tub in advance. You are responsible for bringing all accessories listed above. If you do not have all necessary supplies you cannot have a waterbirth.    Things that would prevent you from having a waterbirth:  Premature, <37wks  Previous cesarean birth  Presence of thick meconium-stained fluid  Multiple gestation (Twins,   triplets, etc.)  Uncontrolled diabetes or gestational diabetes requiring medication  Hypertension requiring medication or diagnosis of pre-eclampsia  Heavy vaginal bleeding  Non-reassuring fetal  heart rate  Active infection (MRSA, etc.). Group B Strep is NOT a contraindication for  waterbirth.  If your labor has to be induced and induction method requires continuous  monitoring of the baby's heart rate  Other risks/issues identified by your obstetrical provider  Please remember that birth is unpredictable. Under certain unforeseeable circumstances your provider may advise against giving birth in the tub. These decisions will be made on a case-by-case basis and with the safety of you and your baby as our highest priority.   Safe Medications in Pregnancy   Acne:  Benzoyl Peroxide  Salicylic Acid   Backache/Headache:  Tylenol: 2 regular strength every 4 hours OR        2 Extra strength every 6 hours   Colds/Coughs/Allergies:  Benadryl (alcohol free) 25 mg every 6 hours as needed  Breath right strips  Claritin  Cepacol throat lozenges  Chloraseptic throat spray  Cold-Eeze- up to three times per day  Cough drops, alcohol free  Flonase (by prescription only)  Guaifenesin  Mucinex  Robitussin DM (plain only, alcohol free)  Saline nasal spray/drops  Sudafed (pseudoephedrine) & Actifed * use only after [redacted] weeks gestation and if you do not have high blood pressure  Tylenol  Vicks Vaporub  Zinc lozenges  Zyrtec   Constipation:  Colace  Ducolax suppositories  Fleet enema  Glycerin suppositories  Metamucil  Milk of magnesia  Miralax  Senokot  Smooth move tea   Diarrhea:  Kaopectate  Imodium A-D   *NO pepto Bismol   Hemorrhoids:  Anusol  Anusol HC  Preparation H  Tucks   Indigestion:  Tums  Maalox  Mylanta  Zantac  Pepcid   Insomnia:  Benadryl (alcohol free) 25mg every 6 hours as needed  Tylenol PM  Unisom, no Gelcaps   Leg Cramps:  Tums  MagGel   Nausea/Vomiting:  Bonine  Dramamine  Emetrol  Ginger extract  Sea bands  Meclizine  Nausea medication to take during pregnancy:  Unisom (doxylamine succinate 25 mg tablets) Take one  tablet daily at bedtime. If symptoms are not adequately controlled, the dose can be increased to a maximum recommended dose of two tablets daily (1/2 tablet in the morning, 1/2 tablet mid-afternoon and one at bedtime).  Vitamin B6 100mg tablets. Take one tablet twice a day (up to 200 mg per day).   Skin Rashes:  Aveeno products  Benadryl cream or 25mg every 6 hours as needed  Calamine Lotion  1% cortisone cream   Yeast infection:  Gyne-lotrimin 7  Monistat 7    **If taking multiple medications, please check labels to avoid duplicating the same active ingredients  **take medication as directed on the label  ** Do not exceed 4000 mg of tylenol in 24 hours  **Do not take medications that contain aspirin or ibuprofen            

## 2019-05-26 NOTE — Progress Notes (Signed)
   PRENATAL VISIT NOTE  Subjective:  Kristina Dunn is a 28 y.o. [id number] at [redacted]w[redacted]d being seen today for ongoing prenatal care.  She is currently monitored for the following issues for this low-risk pregnancy and has Supervision of low-risk pregnancy and Late prenatal care affecting pregnancy on their problem list.  Patient reports no complaints.  Contractions: Not present. Vag. Bleeding: None.  Movement: Present. Denies leaking of fluid.   The following portions of the patient's history were reviewed and updated as appropriate: allergies, current medications, past family history, past medical history, past social history, past surgical history and problem list.   Objective:   Vitals:   05/26/19 0849  BP: 133/85  Pulse: (!) 101  Temp: 98.1 F (36.7 C)  Weight: 147 lb 14.4 oz (67.1 kg)    Fetal Status: Fetal Heart Rate (bpm): 151 Fundal Height: 30 cm Movement: Present     General:  Alert, oriented and cooperative. Patient is in no acute distress.  Skin: Skin is warm and dry. No rash noted.   Cardiovascular: Normal heart rate noted  Respiratory: Normal respiratory effort, no problems with respiration noted  Abdomen: Soft, gravid, appropriate for gestational age.  Pain/Pressure: Present     Pelvic: Cervical exam performed Dilation: Closed Effacement (%): Thick Station: Ballotable  Extremities: Normal range of motion.  Edema: Trace  Mental Status: Normal mood and affect. Normal behavior. Normal judgment and thought content.   Assessment and Plan:  Pregnancy: [id number] at [redacted]w[redacted]d 1. Encounter for supervision of low-risk pregnancy in third trimester - New OB labs today including 2 hour GTT - Tdap vaccine greater than or equal to 7yo IM - Ambulatory referral to [place] forms signed  - Discussed cadence for LOB visits and possibility of virtual visits for certain ones - Discussed the nature of our practice with multiple providers and hospital call schedule   2.  Late prenatal care affecting pregnancy in third trimester - First visit at 31 weeks - No prenatal care with last pregnancy   Preterm labor symptoms and general obstetric precautions including but not limited to vaginal bleeding, contractions, leaking of fluid and fetal movement were reviewed in detail with the patient. Please refer to After Visit Summary for other counseling recommendations.   Return in about 2 weeks (around 06/09/2019) for LOB, Virtual.  Future Appointments  Date Time Provider [place]  05/26/2019  9:30 AM [facility] LAB [facility] WOC  05/26/2019 10:00 AM [place]  05/26/2019  1:00 PM [place] [facility]-US  05/26/2019  1:00 PM [place] [facility]-M[facility] MFC-US    Kerry Hough, PA-C

## 2019-05-27 LAB — CYTOLOGY - PAP
Chlamydia: NEGATIVE
Diagnosis: NEGATIVE
Neisseria Gonorrhea: NEGATIVE

## 2019-05-31 LAB — CULTURE, OB URINE

## 2019-05-31 LAB — URINE CULTURE, OB REFLEX

## 2019-06-02 ENCOUNTER — Encounter: Payer: Self-pay | Admitting: *Deleted

## 2019-06-02 LAB — OBSTETRIC PANEL, INCLUDING HIV
Antibody Screen: NEGATIVE
Basophils Absolute: 0.1 10*3/uL (ref 0.0–0.2)
Basos: 1 %
EOS (ABSOLUTE): 0.2 10*3/uL (ref 0.0–0.4)
Eos: 2 %
HIV Screen 4th Generation wRfx: NONREACTIVE
Hematocrit: 32.7 % — ABNORMAL LOW (ref 34.0–46.6)
Hemoglobin: 10.8 g/dL — ABNORMAL LOW (ref 11.1–15.9)
Hepatitis B Surface Ag: NEGATIVE
Immature Grans (Abs): 0.2 10*3/uL — ABNORMAL HIGH (ref 0.0–0.1)
Immature Granulocytes: 1 %
Lymphocytes Absolute: 2.2 10*3/uL (ref 0.7–3.1)
Lymphs: 20 %
MCH: 27.8 pg (ref 26.6–33.0)
MCHC: 33 g/dL (ref 31.5–35.7)
MCV: 84 fL (ref 79–97)
Monocytes Absolute: 0.6 10*3/uL (ref 0.1–0.9)
Monocytes: 6 %
Neutrophils Absolute: 7.7 10*3/uL — ABNORMAL HIGH (ref 1.4–7.0)
Neutrophils: 70 %
Platelets: 287 10*3/uL (ref 150–450)
RBC: 3.89 x10E6/uL (ref 3.77–5.28)
RDW: 13.7 % (ref 11.7–15.4)
RPR Ser Ql: NONREACTIVE
Rh Factor: POSITIVE
Rubella Antibodies, IGG: 3.42 index (ref >0.99)
WBC: 10.9 10*3/uL — ABNORMAL HIGH (ref 3.4–10.8)

## 2019-06-02 LAB — GLUCOSE TOLERANCE, 2 HOURS W/ 1HR
Glucose, 1 hour: 152 mg/dL (ref 65–179)
Glucose, 2 hour: 117 mg/dL (ref 65–152)
Glucose, Fasting: 75 mg/dL (ref 65–91)

## 2019-06-08 ENCOUNTER — Encounter: Payer: Self-pay | Admitting: *Deleted

## 2019-06-09 ENCOUNTER — Telehealth (INDEPENDENT_AMBULATORY_CARE_PROVIDER_SITE_OTHER): Payer: Medicaid Other

## 2019-06-09 ENCOUNTER — Telehealth: Payer: Self-pay | Admitting: Obstetrics and Gynecology

## 2019-06-09 ENCOUNTER — Other Ambulatory Visit: Payer: Self-pay

## 2019-06-09 DIAGNOSIS — Z3493 Encounter for supervision of normal pregnancy, unspecified, third trimester: Secondary | ICD-10-CM

## 2019-06-09 DIAGNOSIS — Z3A33 33 weeks gestation of pregnancy: Secondary | ICD-10-CM

## 2019-06-09 DIAGNOSIS — O0933 Supervision of pregnancy with insufficient antenatal care, third trimester: Secondary | ICD-10-CM

## 2019-06-09 NOTE — Progress Notes (Signed)
   TELEHEALTH VIRTUAL OBSTETRICS VISIT ENCOUNTER NOTE  I connected with [name] on 06/09/19 at 11:15 AM EDT by telephone at home and verified that I am speaking with the correct person using two identifiers.   I discussed the limitations, risks, security and privacy concerns of performing an evaluation and management service by telephone and the availability of in person appointments. I also discussed with the patient that there may be a patient responsible charge related to this service. The patient expressed understanding and agreed to proceed.  Subjective:  Ilo Beamon is a 28 y.o. [id number] at [redacted]w[redacted]d being followed for ongoing prenatal care.  She is currently monitored for the following issues for this low-risk pregnancy and has Supervision of low-risk pregnancy and Late prenatal care affecting pregnancy on their problem list.  Patient reports no complaints. Reports fetal movement. Denies any contractions, bleeding or leaking of fluid.   The following portions of the patient's history were reviewed and updated as appropriate: allergies, current medications, past family history, past medical history, past social history, past surgical history and problem list.   Objective:   General:  Alert, oriented and cooperative.   Mental Status: Normal mood and affect perceived. Normal judgment and thought content.  Rest of physical exam deferred due to type of encounter  Assessment and Plan:  Pregnancy: [id number] at [redacted]w[redacted]d 1. Encounter for supervision of low-risk pregnancy in third trimester -BP 135/90, denies HA, visual changes or epigastric pain. Will continue to monitor -Patient doing well without complaint. -Anticipatory guidance for next visits reviewed with patient.  -Follow up growth u/s scheduled for 7/28  2. Late prenatal care affecting pregnancy in third trimester  Preterm labor symptoms and general obstetric precautions including but not limited to vaginal bleeding, contractions,  leaking of fluid and fetal movement were reviewed in detail with the patient.  I discussed the assessment and treatment plan with the patient. The patient was provided an opportunity to ask questions and all were answered. The patient agreed with the plan and demonstrated an understanding of the instructions. The patient was advised to call back or seek an in-person office evaluation/go to MAU at [facility] for any urgent or concerning symptoms. Please refer to After Visit Summary for other counseling recommendations.   I provided 10 minutes of non-face-to-face time during this encounter.  Return in about 3 weeks (around 06/30/2019) for Return OB visit/GBS.  Future Appointments  Date Time Provider [place]  06/23/2019 12:45 PM [place], [place] for [employer], [place]

## 2019-06-09 NOTE — Progress Notes (Signed)
10:52- Called pt for My Chart visit, no answer & does not have VM setup. Will attempt again within 10 to 15 minutes.   11:13a-PT ANSWERED  Pt states has been having pain in legs

## 2019-06-15 ENCOUNTER — Encounter (HOSPITAL_COMMUNITY): Payer: Self-pay

## 2019-06-15 ENCOUNTER — Other Ambulatory Visit: Payer: Self-pay

## 2019-06-15 ENCOUNTER — Inpatient Hospital Stay (HOSPITAL_COMMUNITY)
Admission: AD | Admit: 2019-06-15 | Discharge: 2019-06-17 | DRG: 807 | Disposition: A | Discharge status: Home / Self Care | Payer: Medicaid Other | Attending: Obstetrics and Gynecology | Admitting: Obstetrics and Gynecology

## 2019-06-15 DIAGNOSIS — Z3A34 34 weeks gestation of pregnancy: Secondary | ICD-10-CM

## 2019-06-15 DIAGNOSIS — Z3493 Encounter for supervision of normal pregnancy, unspecified, third trimester: Secondary | ICD-10-CM

## 2019-06-15 DIAGNOSIS — F17210 Nicotine dependence, cigarettes, uncomplicated: Secondary | ICD-10-CM | POA: Diagnosis present

## 2019-06-15 DIAGNOSIS — O42013 Preterm premature rupture of membranes, onset of labor within 24 hours of rupture, third trimester: Secondary | ICD-10-CM

## 2019-06-15 DIAGNOSIS — O99824 Streptococcus B carrier state complicating childbirth: Secondary | ICD-10-CM | POA: Diagnosis not present

## 2019-06-15 DIAGNOSIS — O99334 Smoking (tobacco) complicating childbirth: Secondary | ICD-10-CM | POA: Diagnosis present

## 2019-06-15 DIAGNOSIS — Z1159 Encounter for screening for other viral diseases: Secondary | ICD-10-CM

## 2019-06-15 DIAGNOSIS — O42913 Preterm premature rupture of membranes, unspecified as to length of time between rupture and onset of labor, third trimester: Secondary | ICD-10-CM | POA: Diagnosis present

## 2019-06-15 DIAGNOSIS — O0930 Supervision of pregnancy with insufficient antenatal care, unspecified trimester: Secondary | ICD-10-CM

## 2019-06-15 LAB — CBC
HCT: 34.7 % — ABNORMAL LOW (ref 36.0–46.0)
Hemoglobin: 11 g/dL — ABNORMAL LOW (ref 12.0–15.0)
MCH: 27.2 pg (ref 26.0–34.0)
MCHC: 31.7 g/dL (ref 30.0–36.0)
MCV: 85.7 fL (ref 80.0–100.0)
Platelets: 319 10*3/uL (ref 150–400)
RBC: 4.05 MIL/uL (ref 3.87–5.11)
RDW: 15 % (ref 11.5–15.5)
WBC: 12.7 10*3/uL — ABNORMAL HIGH (ref 4.0–10.5)
nRBC: 0 % (ref 0.0–0.2)

## 2019-06-15 LAB — URINALYSIS, ROUTINE W REFLEX MICROSCOPIC
Bacteria, UA: NONE SEEN
Bilirubin Urine: NEGATIVE
Glucose, UA: NEGATIVE mg/dL
Hgb urine dipstick: NEGATIVE
Ketones, ur: NEGATIVE mg/dL
Nitrite: NEGATIVE
Protein, ur: NEGATIVE mg/dL
Specific Gravity, Urine: 1.004 — ABNORMAL LOW (ref 1.005–1.030)
pH: 7 (ref 5.0–8.0)

## 2019-06-15 LAB — COMPREHENSIVE METABOLIC PANEL
ALT: 15 U/L (ref 0–44)
AST: 26 U/L (ref 15–41)
Albumin: 2.6 g/dL — ABNORMAL LOW (ref 3.5–5.0)
Alkaline Phosphatase: 272 U/L — ABNORMAL HIGH (ref 38–126)
Anion gap: 9 (ref 5–15)
BUN: 6 mg/dL (ref 6–20)
CO2: 18 mmol/L — ABNORMAL LOW (ref 22–32)
Calcium: 8.4 mg/dL — ABNORMAL LOW (ref 8.9–10.3)
Chloride: 110 mmol/L (ref 98–111)
Creatinine, Ser: 0.79 mg/dL (ref 0.44–1.00)
GFR calc Af Amer: >60 mL/min (ref >60)
GFR calc non Af Amer: >60 mL/min (ref >60)
Glucose, Bld: 70 mg/dL (ref 70–99)
Potassium: 3.9 mmol/L (ref 3.5–5.1)
Sodium: 137 mmol/L (ref 135–145)
Total Bilirubin: 0.4 mg/dL (ref 0.3–1.2)
Total Protein: 6.2 g/dL — ABNORMAL LOW (ref 6.5–8.1)

## 2019-06-15 LAB — RAPID URINE DRUG SCREEN, HOSP PERFORMED
Amphetamines: NOT DETECTED
Barbiturates: NOT DETECTED
Benzodiazepines: NOT DETECTED
Cocaine: NOT DETECTED
Opiates: NOT DETECTED
Tetrahydrocannabinol: NOT DETECTED

## 2019-06-15 LAB — TYPE AND SCREEN
ABO/RH(D): A POS
Antibody Screen: NEGATIVE

## 2019-06-15 LAB — SARS CORONAVIRUS 2 BY RT PCR (HOSPITAL ORDER, PERFORMED IN ~~LOC~~ HOSPITAL LAB): SARS Coronavirus 2: NEGATIVE

## 2019-06-15 LAB — PROTEIN / CREATININE RATIO, URINE
Creatinine, Urine: 32.05 mg/dL
Total Protein, Urine: <6 mg/dL

## 2019-06-15 LAB — POCT FERN TEST: POCT Fern Test: POSITIVE

## 2019-06-15 LAB — ABO/RH: ABO/RH(D): A POS

## 2019-06-15 MED ORDER — PENICILLIN G 3 MILLION UNITS IVPB - SIMPLE MED: 3.0000 10*6.[IU] | INTRAVENOUS | Status: DC

## 2019-06-15 MED ORDER — LIDOCAINE HCL (PF) 1 % IJ SOLN: 30.0000 mL | INTRAMUSCULAR | Status: DC | PRN

## 2019-06-15 MED ORDER — OXYTOCIN BOLUS FROM INFUSION
500.0000 mL | Freq: Once | INTRAVENOUS | Status: AC
  Administered 2019-06-15: 500 mL via INTRAVENOUS

## 2019-06-15 MED ORDER — SOD CITRATE-CITRIC ACID 500-334 MG/5ML PO SOLN: 30.0000 mL | ORAL | Status: DC | PRN

## 2019-06-15 MED ORDER — SODIUM CHLORIDE 0.9 % IV SOLN: 2.0000 g | Freq: Once | INTRAVENOUS | Status: DC

## 2019-06-15 MED ORDER — LACTATED RINGERS IV SOLN
INTRAVENOUS | Status: DC
  Administered 2019-06-16: 20 mL/h via INTRAVENOUS

## 2019-06-15 MED ORDER — SODIUM CHLORIDE 0.9 % IV SOLN
2.0000 g | Freq: Four times a day (QID) | INTRAVENOUS | Status: DC
  Administered 2019-06-15: 19:00:00 2 g via INTRAVENOUS
  Filled 2019-06-15: qty 2000

## 2019-06-15 MED ORDER — IBUPROFEN 600 MG PO TABS
600.0000 mg | ORAL_TABLET | Freq: Four times a day (QID) | ORAL | Status: DC
  Administered 2019-06-15 – 2019-06-17 (×5): 600 mg via ORAL
  Filled 2019-06-15 (×5): qty 1

## 2019-06-15 MED ORDER — LACTATED RINGERS IV SOLN: 500.0000 mL | INTRAVENOUS | Status: DC | PRN

## 2019-06-15 MED ORDER — ONDANSETRON HCL 4 MG/2ML IJ SOLN: 4.0000 mg | Freq: Four times a day (QID) | INTRAMUSCULAR | Status: DC | PRN

## 2019-06-15 MED ORDER — FLEET ENEMA 7-19 GM/118ML RE ENEM: 1.0000 | ENEMA | RECTAL | Status: DC | PRN

## 2019-06-15 MED ORDER — FENTANYL CITRATE (PF) 100 MCG/2ML IJ SOLN: 100.0000 ug | INTRAMUSCULAR | Status: DC | PRN

## 2019-06-15 MED ORDER — OXYTOCIN 40 UNITS IN NORMAL SALINE INFUSION - SIMPLE MED
2.5000 [IU]/h | INTRAVENOUS | Status: DC
  Administered 2019-06-15: 2.5 [IU]/h via INTRAVENOUS
  Filled 2019-06-15: qty 1000

## 2019-06-15 MED ORDER — LACTATED RINGERS IV SOLN
INTRAVENOUS | Status: DC
  Administered 2019-06-15 (×2): via INTRAVENOUS

## 2019-06-15 MED ORDER — METOCLOPRAMIDE HCL 10 MG PO TABS
10.0000 mg | ORAL_TABLET | Freq: Once | ORAL | Status: DC
  Filled 2019-06-15: qty 1

## 2019-06-15 MED ORDER — BETAMETHASONE SOD PHOS & ACET 6 (3-3) MG/ML IJ SUSP
12.0000 mg | Freq: Once | INTRAMUSCULAR | Status: AC
  Administered 2019-06-15: 12 mg via INTRAMUSCULAR
  Filled 2019-06-15: qty 2

## 2019-06-15 MED ORDER — FAMOTIDINE 20 MG PO TABS
40.0000 mg | ORAL_TABLET | Freq: Once | ORAL | Status: DC
  Filled 2019-06-15: qty 2

## 2019-06-15 NOTE — MAU Note (Signed)
Pt presents to MAU from [facility] ED  c/o possible SROM of clear fluid since last night around 2030. Also having abdominal pain since last night around 2130, but is not sure if it's labor. Denies VB. +FM.

## 2019-06-15 NOTE — MAU Provider Note (Signed)
History     CSN: [id number]  Arrival date and time: 06/15/19 1718   First Provider Initiated Contact with Patient 06/15/19 1756      Chief Complaint  Patient presents with  . Abdominal Pain  . Rupture of Membranes   Kristina Dunn is a 28 y.o. [id number] at [redacted]w[redacted]d who receives care at [facility].  She presents today for Abdominal Pain and Rupture of Membranes. She reports that she has been having "contractions" since last night.  She states that the pain is located in her lower belly and is intermittent in nature. She report fetal movement and denies vaginal bleeding or discharge.  She reports leaking that started yesterday and sexual activity two days ago. She states the fluid is clear and is unsure of whether it has a smell.  She reports wearing a baby diaper since last night stating she was leaking too much to wear a panty liner.   **Nurse walks in at this time and reports positive fern.**     OB History    Gravida  4   Para  2   Term  2   Preterm  0   AB  1   Living  2     SAB  0   TAB  1   Ectopic  0   Multiple  0   Live Births  2           Past Medical History:  Diagnosis Date  . Medical history non-contributory     Past Surgical History:  Procedure Laterality Date  . WISDOM TOOTH EXTRACTION      Family History  Problem Relation Age of Onset  . Cancer Mother   . Diabetes Father     Social History   Tobacco Use  . Smoking status: Current Every Day Smoker    Packs/day: 0.50    Types: Cigarettes  . Smokeless tobacco: Never Used  Substance Use Topics  . Alcohol use: Not Currently  . Drug use: Not Currently    Allergies:  Allergies  Allergen Reactions  . Acetaminophen Nausea And Vomiting    And causes severe abdominal pain    Medications Prior to Admission  Medication Sig Dispense Refill Last Dose  . prenatal vitamin w/FE, FA (PRENATAL 1 + 1) 27-1 MG TABS tablet Take 1 tablet by mouth daily at 12 noon.   Past Week at Unknown time  .  ibuprofen (ADVIL) 200 MG tablet Take 200 mg by mouth every 6 (six) hours as needed.       Review of Systems  Constitutional: Negative for chills and fever.  Gastrointestinal: Positive for abdominal pain and constipation. Negative for diarrhea, nausea and vomiting.  Genitourinary: Negative for difficulty urinating, dysuria, vaginal bleeding and vaginal discharge.  Musculoskeletal: Negative for back pain.   Physical Exam   Blood pressure 135/87, pulse 90, temperature 97.8 F (36.6 C), temperature source Oral, resp. rate 18, height 5\' 9"  (1.753 m), weight 68.8 kg, last menstrual period 10/18/2018, SpO2 100 %.  Physical Exam  Constitutional: She is oriented to person, place, and time. She appears well-developed and well-nourished.  HENT:  Head: Normocephalic and atraumatic.  Eyes: Conjunctivae are normal.  Neck: Normal range of motion.  Cardiovascular: Normal rate.  Respiratory: Effort normal.  Genitourinary:    Genitourinary Comments: Sterile Speculum Exam: -Vaginal Vault: Pink mucosa.  No apparent lesions.  Grossly Ruptured.  -Cervix:  Appears open ~ 4cm Fetal hair noted.  -GBS Collected  -Bimanual Exam: Dilation: 6 Effacement (%):  80 Station: -1 Presentation: Vertex Exam by::  Sabas SousJ Martha Soltys CNM      Musculoskeletal: Normal range of motion.  Neurological: She is alert and oriented to person, place, and time.  Skin: Skin is warm and dry.  Psychiatric: She has a normal mood and affect. Her behavior is normal.    Fetal Assessment 145 bpm, Mod Var, -Decels, +Accels Toco: palpates moderate  MAU Course   Results for orders placed or performed during the hospital encounter of 06/15/19 (from the past 24 hour(s))  Urinalysis, Routine w reflex microscopic     Status: Abnormal   Collection Time: 06/15/19  5:53 PM  Result Value Ref Range   Color, Urine STRAW (A) YELLOW   APPearance CLEAR CLEAR   Specific Gravity, Urine 1.004 (L) 1.005 - 1.030   pH 7.0 5.0 - 8.0   Glucose, UA NEGATIVE  NEGATIVE mg/dL   Hgb urine dipstick NEGATIVE NEGATIVE   Bilirubin Urine NEGATIVE NEGATIVE   Ketones, ur NEGATIVE NEGATIVE mg/dL   Protein, ur NEGATIVE NEGATIVE mg/dL   Nitrite NEGATIVE NEGATIVE   Leukocytes,Ua TRACE (A) NEGATIVE   WBC, UA 6-10 0 - 5 WBC/hpf   Bacteria, UA NONE SEEN NONE SEEN   Squamous Epithelial / LPF 0-5 0 - 5  Fern Test     Status: Abnormal   Collection Time: 06/15/19  5:58 PM  Result Value Ref Range   POCT Fern Test Positive = ruptured amniotic membanes   Type and screen [facility]     Status: None (Preliminary result)   Collection Time: 06/15/19  6:19 PM  Result Value Ref Range   ABO/RH(D) PENDING    Antibody Screen PENDING    Sample Expiration      06/18/2019,2359 Performed at [facility], [address] [place], [place] [address]    No results found.  MDM PE Labs:GBS, UDS EFM  Assessment and Plan  28 year old [id number]  SIUP at 34.2weeks Cat I FT PPROM   -Exam findings discussed -Labor Team notified of patient status. -GBS ordered -BMZ ordered. -UDS Ordered -Admit orders to be placed  Cherre RobinsJessica L Rema Lievanos MSN, CNM 06/15/2019, 5:57 PM

## 2019-06-15 NOTE — H&P (Addendum)
LABOR AND DELIVERY ADMISSION HISTORY AND PHYSICAL NOTE  Kristina Dunn is a 28 y.o. female [phone] with IUP at [redacted]w[redacted]d by LMP presenting for PTL w/ SROM.  She reports positive fetal movement. She denies leakage of fluid or vaginal bleeding.  Prenatal History/Complications: PNC at [place] Pregnancy complications:  -late [facility] -PPROM  Past Medical History: Past Medical History:  Diagnosis Date  . Medical history non-contributory     Past Surgical History: Past Surgical History:  Procedure Laterality Date  . WISDOM TOOTH EXTRACTION      Obstetrical History: OB History    Gravida  4   Para  2   Term  2   Preterm  0   AB  1   Living  2     SAB  0   TAB  1   Ectopic  0   Multiple  0   Live Births  2           Social History: Social History   Socioeconomic History  . Marital status: Significant Other    Spouse name: Not on file  . Number of children: Not on file  . Years of education: Not on file  . Highest education level: Not on file  Occupational History  . Not on file  Social Needs  . Financial resource strain: Not on file  . Food insecurity    Worry: Sometimes true    Inability: Sometimes true  . Transportation needs    Medical: Yes    Non-medical: Yes  Tobacco Use  . Smoking status: Current Every Day Smoker    Packs/day: 0.50    Types: Cigarettes  . Smokeless tobacco: Never Used  Substance and Sexual Activity  . Alcohol use: Not Currently  . Drug use: Not Currently  . Sexual activity: Yes  Lifestyle  . Physical activity    Days per week: Not on file    Minutes per session: Not on file  . Stress: Not on file  Relationships  . Social Herbalist on phone: Not on file    Gets together: Not on file    Attends religious service: Not on file    Active member of club or organization: Not on file    Attends meetings of clubs or organizations: Not on file    Relationship status: Not on file  Other Topics Concern  . Not on file   Social History Narrative  . Not on file    Family History: Family History  Problem Relation Age of Onset  . Cancer Mother   . Diabetes Father     Allergies: Allergies  Allergen Reactions  . Acetaminophen Nausea And Vomiting    And causes severe abdominal pain    Medications Prior to Admission  Medication Sig Dispense Refill Last Dose  . prenatal vitamin w/FE, FA (PRENATAL 1 + 1) 27-1 MG TABS tablet Take 1 tablet by mouth daily at 12 noon.   Past Week at Unknown time     Review of Systems  All systems reviewed and negative except as stated in HPI  Physical Exam Blood pressure 135/87, pulse 90, temperature 97.8 F (36.6 C), temperature source Oral, resp. rate 18, height 5\' 9"  (1.753 m), weight 72.1 kg, last menstrual period 10/18/2018, SpO2 98 %. General appearance: alert, oriented Lungs: normal respiratory effort Heart: regular rate Abdomen: soft, non-tender; gravid, FH appropriate for GA Extremities: No calf swelling or tenderness Presentation: cephalic Fetal monitoring: 145bpm, moderate variability, 15 x 15 acel,  occasional variable decel Uterine activity: minimal Dilation: 7 Effacement (%): 80 Station: 0 Exam by:: Ashley MarinerYancey Manhard RNC   Prenatal labs: ABO, Rh: --/--/PENDING (07/20 1819) Antibody: PENDING (07/20 1819) Rubella: 3.42 (06/30 0909) RPR: Non Reactive (06/30 0909)  HBsAg: Negative (06/30 0909)  HIV: Non Reactive (06/30 0909)  GC/Chlamydia: Negative GBS:  Labs pending 2-hr GTT: WNL Genetic screening: Low risk but too late for AFP Anatomy US: Fetal anatomy appears normal, but limited by  advanced gestational age.  Prenatal Transfer Tool  Maternal Diabetes: No Genetic Screening: Normal Maternal Ultrasounds/Referrals: Normal Fetal Ultrasounds or other Referrals:  Referred to Materal Fetal Medicine  Maternal Substance Abuse:  No Significant Maternal Medications:  None Significant Maternal Lab Results: Other: GBS unknown at this time  Results  for orders placed or performed during the hospital encounter of 06/15/19 (from the past 24 hour(s))  Urinalysis, Routine w reflex microscopic   Collection Time: 06/15/19  5:53 PM  Result Value Ref Range   Color, Urine STRAW (A) YELLOW   APPearance CLEAR CLEAR   Specific Gravity, Urine 1.004 (L) 1.005 - 1.030   pH 7.0 5.0 - 8.0   Glucose, UA NEGATIVE NEGATIVE mg/dL   Hgb urine dipstick NEGATIVE NEGATIVE   Bilirubin Urine NEGATIVE NEGATIVE   Ketones, ur NEGATIVE NEGATIVE mg/dL   Protein, ur NEGATIVE NEGATIVE mg/dL   Nitrite NEGATIVE NEGATIVE   Leukocytes,Ua TRACE (A) NEGATIVE   WBC, UA 6-10 0 - 5 WBC/hpf   Bacteria, UA NONE SEEN NONE SEEN   Squamous Epithelial / LPF 0-5 0 - 5  Fern Test   Collection Time: 06/15/19  5:58 PM  Result Value Ref Range   POCT Fern Test Positive = ruptured amniotic membanes   CBC   Collection Time: 06/15/19  6:19 PM  Result Value Ref Range   WBC 12.7 (H) 4.0 - 10.5 K/uL   RBC 4.05 3.87 - 5.11 MIL/uL   Hemoglobin 11.0 (L) 12.0 - 15.0 g/dL   HCT 81.134.7 (L) 91.436.0 - 78.246.0 %   MCV 85.7 80.0 - 100.0 fL   MCH 27.2 26.0 - 34.0 pg   MCHC 31.7 30.0 - 36.0 g/dL   RDW 95.615.0 21.311.5 - 08.615.5 %   Platelets 319 150 - 400 K/uL   nRBC 0.0 0.0 - 0.2 %  Type and screen [facility]   Collection Time: 06/15/19  6:19 PM  Result Value Ref Range   ABO/RH(D) PENDING    Antibody Screen PENDING    Sample Expiration      06/18/2019,2359 Performed at [facility], [address] [place], [place] [address]     Patient Active Problem List   Diagnosis Date Noted  . Preterm labor 06/15/2019  . Supervision of low-risk pregnancy 05/18/2019  . Late prenatal care affecting pregnancy 05/18/2019    Assessment: Kristina Dunn is a 28 y.o. [id number] at 720w2d here for PTL/PROM  #Labor: Continue cervical exams for the progression of labor. No augmentation of this time. #Pain: Maternally support, may have epidural. #FWB: Cat I #ID: GBS status pending, will give  ampicillin  #MOF: Breast #MOC: BTL, interval  #Circ:  Yes  [name], DO Family Medicine Resident, PGY-1 06/15/2019, 6:56 PM   Attestation: I have seen this patient and agree with the resident's documentation. I have examined them separately, and we have discussed the plan of care.  Cristal DeerLaurel S. Earlene PlaterWallace, DO OB/GYN Fellow

## 2019-06-15 NOTE — ED Triage Notes (Signed)
Here c/o abdominal cramping that feels like when she had her last baby.  Also reports she feels like she's peeing on herself since last night and that her water may have broken.  MAU made aware.  Waiting for transporter.

## 2019-06-15 NOTE — Discharge Summary (Addendum)
error 

## 2019-06-16 ENCOUNTER — Encounter (HOSPITAL_COMMUNITY)
Admission: AD | Disposition: A | Discharge status: Home / Self Care | Payer: Self-pay | Source: Home / Self Care | Attending: Obstetrics and Gynecology

## 2019-06-16 ENCOUNTER — Encounter (HOSPITAL_COMMUNITY): Payer: Self-pay | Admitting: Anesthesiology

## 2019-06-16 ENCOUNTER — Encounter (HOSPITAL_COMMUNITY): Payer: Self-pay

## 2019-06-16 LAB — RPR: RPR Ser Ql: NONREACTIVE

## 2019-06-16 LAB — GLUCOSE, CAPILLARY: Glucose-Capillary: 76 mg/dL (ref 70–99)

## 2019-06-16 SURGERY — LIGATION, FALLOPIAN TUBE, POSTPARTUM
Anesthesia: Choice

## 2019-06-16 MED ORDER — ZOLPIDEM TARTRATE 5 MG PO TABS: 5.0000 mg | ORAL_TABLET | Freq: Every evening | ORAL | Status: DC | PRN

## 2019-06-16 MED ORDER — WITCH HAZEL-GLYCERIN EX PADS: 1.0000 "application " | MEDICATED_PAD | CUTANEOUS | Status: DC | PRN

## 2019-06-16 MED ORDER — DIPHENHYDRAMINE HCL 25 MG PO CAPS: 25.0000 mg | ORAL_CAPSULE | Freq: Four times a day (QID) | ORAL | Status: DC | PRN

## 2019-06-16 MED ORDER — ONDANSETRON HCL 4 MG PO TABS: 4.0000 mg | ORAL_TABLET | ORAL | Status: DC | PRN

## 2019-06-16 MED ORDER — COCONUT OIL OIL: 1.0000 "application " | TOPICAL_OIL | Status: DC | PRN

## 2019-06-16 MED ORDER — MEASLES, MUMPS & RUBELLA VAC IJ SOLR: 0.5000 mL | Freq: Once | INTRAMUSCULAR | Status: DC

## 2019-06-16 MED ORDER — TETANUS-DIPHTH-ACELL PERTUSSIS 5-2.5-18.5 LF-MCG/0.5 IM SUSP: 0.5000 mL | Freq: Once | INTRAMUSCULAR | Status: DC

## 2019-06-16 MED ORDER — SIMETHICONE 80 MG PO CHEW: 80.0000 mg | CHEWABLE_TABLET | ORAL | Status: DC | PRN

## 2019-06-16 MED ORDER — PRENATAL MULTIVITAMIN CH
1.0000 | ORAL_TABLET | Freq: Every day | ORAL | Status: DC
  Administered 2019-06-16: 1 via ORAL
  Filled 2019-06-16: qty 1

## 2019-06-16 MED ORDER — BENZOCAINE-MENTHOL 20-0.5 % EX AERO
1.0000 "application " | INHALATION_SPRAY | CUTANEOUS | Status: DC | PRN
  Administered 2019-06-16: 1 via TOPICAL
  Filled 2019-06-16: qty 56

## 2019-06-16 MED ORDER — ONDANSETRON HCL 4 MG/2ML IJ SOLN: 4.0000 mg | INTRAMUSCULAR | Status: DC | PRN

## 2019-06-16 MED ORDER — DIBUCAINE (PERIANAL) 1 % EX OINT: 1.0000 "application " | TOPICAL_OINTMENT | CUTANEOUS | Status: DC | PRN

## 2019-06-16 MED ORDER — SENNOSIDES-DOCUSATE SODIUM 8.6-50 MG PO TABS
2.0000 | ORAL_TABLET | ORAL | Status: DC
  Administered 2019-06-16 (×2): 2 via ORAL
  Filled 2019-06-16 (×2): qty 2

## 2019-06-16 MED ORDER — ACETAMINOPHEN 325 MG PO TABS: 650.0000 mg | ORAL_TABLET | ORAL | Status: DC | PRN

## 2019-06-16 NOTE — Progress Notes (Signed)
RN asked Kristina Dunn if she wanted to be shown how to set up and operate breast pump. Kristina Dunn refused.

## 2019-06-16 NOTE — Progress Notes (Signed)
Pt back on unit. MD talked to pt. Pt is not being discharged.

## 2019-06-16 NOTE — Plan of Care (Signed)
  Problem: Education: Goal: Knowledge of condition will improve Outcome: Progressing   Problem: Activity: Goal: Ability to tolerate increased activity will improve Outcome: Completed/Met   Problem: Coping: Goal: Ability to identify and utilize available resources and services will improve Outcome: Completed/Met

## 2019-06-16 NOTE — Progress Notes (Signed)
Post Partum Day 1 Subjective: no complaints, voiding, tolerating PO and declines BTL  Objective: Blood pressure 126/83, pulse 83, temperature 98.4 F (36.9 C), temperature source Oral, resp. rate 16, height 5\' 9"  (1.753 m), weight 72.1 kg, last menstrual period 10/18/2018, SpO2 98 %, unknown if currently breastfeeding.  Physical Exam:  General: alert, cooperative and no distress Lochia: appropriate Uterine Fundus: firm Incision: n/a DVT Evaluation: No evidence of DVT seen on physical exam.  Recent Labs    06/15/19 1819  HGB 11.0*  HCT 34.7*    Assessment/Plan: Plan for discharge tomorrow and Social Work consult   LOS: 1 day   Emeterio Reeve 06/16/2019, 10:50 AM

## 2019-06-16 NOTE — Progress Notes (Signed)
CSW attempted to complete a clinical assessment with MOB.  When CSW arrived, MOB was pumping in bed and FOB was asleep on the couch.  CSW explained CSW's role and MOB woke FOB.  CSW explained CSW's role to FOB and FOB stated, "We are not talking to any damn social workers from Social Services today."  CSW emphasized that CSW was not from Social Services and that CSW was the NICU social worker.  FOB became a little more relaxed and MOB stated, "I still don't want to answer any questions."   CSW was understanding and requested to come back at a later time.  MOB declined and stated, "No you don't have to come back because I'm not answering any questions."  CSW asked MOB to verify her address and telephone number and MOB did without incident. CSW asked FOB for his name and FOB refused to provide his name and/or contact information.   CSW updated bedside nurse and noticed that FOB was leaving the room.   CSW returned to MOB's room while she was alone to assess for safety.  When CSW entered the room, MOB rolled her eyes.  CSW assured MOB that she just wanted to offer the family resources and supports while infant is in the NICU. CSW assessed for safety and MOB denied SI, HI, and DV.  MOB reported feeling safe with FOB in the hospital and reported feeling safe at home with FOB.    MOB confirmed that she did not have custody of her older 2 children however refused to provide CSW with any additional information.  After probing questions, MOB acknowledged that her children resides with her mother, but declined to provide her mother's name or contact information.   CSW asked about peds follow-up and MOB reported not having a pediatrician pick out. CSW offered to leave a list of local pediatrician in infant's room and MOB agreed.   CSW left a message for CPS intake worker A. Young to make a CPS reported regarding no custody of older children and CPS hx.   Kristina Dunn, MSW, LCSW Clinical Social  Work [phone] 

## 2019-06-16 NOTE — Progress Notes (Signed)
Pt told RN at 1530 that she was going to NICU to visit baby and she would leave edinburgh depression scale completed for nurse. RN checks pt room at 1715, RN assumes pt is in NICU and charts completed edinburgh. Security calls RN at 1725 to tells RN pt left in a white truck. RN calls NICU to make sure pt is not there. NICU RN says mother has not been to NICU all day. Pt still has belongings in room. MD notified and discharged pt for leaving premises. Pt had IV when she left hospital. Pt has not returned to hospital as of 1800.

## 2019-06-16 NOTE — Progress Notes (Addendum)
RN in pt room to give medications and to assess pt. FOB is on pt's speaker phone wanting to know why RN is in room. Pt tells FOB what the RN is giving her ibuprofen and checking her bleeding. FOB then proceeds to discuss how social work should not return to pt's room when FOB and pt ask her to leave. FOB stated he contacted [employer] about this situation and that I needed to contact security to let them know he was coming back to the hospital. Social work and security notified about the situation.

## 2019-06-16 NOTE — Progress Notes (Signed)
Patient refused to start pumping breast.

## 2019-06-16 NOTE — Lactation Note (Signed)
This note was copied from a baby's chart. Lactation Consultation Note  Patient Name: Kristina Dunn [id number] Date: 06/16/2019    Spoke with RN.  She will call if mother needs LC assistance.    Maternal Data    Feeding    LATCH Score                   Interventions    Lactation Tools Discussed/Used     Consult Status      Carlye Grippe 06/16/2019, 10:26 AM

## 2019-06-17 LAB — CULTURE, BETA STREP (GROUP B ONLY)

## 2019-06-17 MED ORDER — IBUPROFEN 600 MG PO TABS: 600.0000 mg | ORAL_TABLET | Freq: Four times a day (QID) | ORAL | 0 refills | Status: DC

## 2019-06-17 NOTE — Discharge Instructions (Signed)
Before [facility] Once your baby is home with you, things may become a bit hectic as you map out a schedule around your newborn's patterns. Preparing the things you need at home before that time comes is important. Before your baby arrives, make sure you:  Have all the supplies that you will need to care for your baby.  Know where to go if there is an emergency.  Discuss the baby's arrival with other family members. What supplies will I need? Having the following supplies ready before your baby arrives will help ensure that you are prepared: Large items  Crib or bassinet and mattress. Make sure to follow safe sleep recommendations to reduce the risk of sudden infant death syndrome.  Rear-facing infant car seat. Have a trained professional check to make sure that it is installed in your car correctly. Many hospitals and fire departments perform this service free of charge.  Stroller. Always make sure any products--including cribs, mattresses, bassinets, or portable cribs and play areas--are safe. Check for recalls on your specific brand and model of crib. Breastfeeding  Nursing pillow.  Milk storage containers or bags.  Nipple cream.  Nursing bra.  Breast pads.  Breast pump.  Breast shields. Feeding  Formula.  Purified bottled water.  6-8 bottles (4-5 oz bottles and 8-9 oz bottles).  6-8 bottle nipples.  Bibs and burp cloths.  Bottle brush.  Bottle sterilizer (or a pot with a lid). [place].  Mild baby soap and baby shampoo.  Soft cloth towel and washcloth.  Hooded towel. Diapering  Diapers. You may need to use as many as 10-12 diapers each day.  Baby wipes.  Diaper cream.  Petroleum jelly.  Changing pad.  Hand sanitizer. Health and safety  Rectal thermometer.  Infant medicines.  Bulb syringe.  Baby nail clippers.  Baby monitor.  2-3 pacifiers, if desired. Sleeping  Sleep sack or swaddling blanket.  Firm  mattress pad and fitted sheets for the crib or bassinet. Other supplies  Diaper bag.  Clothing, including one-piece outfits and pajamas.  Receiving blankets. Follow these instructions at home: Preparing for an emergency Prepare for an emergency by taking these steps:  Know when to seek care or call your health care provider.  Know how to get to the nearest hospital.  List the phone numbers of your baby's health care providers near your home phone and in your cell phone.  Take an infant first aid and CPR class.  Place the phone number for the poison control center on your refrigerator.  If there will be caregivers in the home, make sure your phone number, emergency contacts, and address are placed on the refrigerator in case they need to be given to emergency services. Preparing your family   Create a plan for visitors. Keep your baby away from people who have a cough, fever, or other symptoms of illness.  Prepare freezer meals ahead of time, and ask friends and family to help with meal preparation, errands, and everyday tasks.  If you have other children: ? Talk with them about the baby coming home. Ask them how they feel about it. ? Read a book together about being a new big brother or sister. ? Find ways to let them help you prepare for the new baby. ? Have someone ready to care for them while you are in the hospital. Where to find more information  Consumer Product Safety Commission: ScanFund.tn  American Academy of Pediatrics: www.healthychildren.org  Safe Kids  Worldwide: www.safekids.org °Summary °· Planning is important before bringing your baby home from the hospital. You will need to have certain supplies ready before your baby arrives. °· You will need to have a rear-facing infant car seat ready prior to bringing your baby home. Have a trained professional check to make sure that it is installed in your car correctly. °· Always make sure any products--including  cribs, mattresses, bassinets, or portable cribs and play areas--are safe. Check for recalls on your specific brand and model of crib. °· Know when to seek care or call your health care provider, and know how to get to the nearest hospital. °This information is not intended to replace advice given to you by your health care provider. Make sure you discuss any questions you have with your health care provider. °Document Released: 10/25/2008 Document Revised: 10/25/2017 Document Reviewed: 10/02/2017 °Elsevier Patient Education © 2020 Elsevier Inc. ° °

## 2019-06-17 NOTE — Progress Notes (Signed)
Discharge teaching complete with pt. Pt understood all information and did not have any questions. 

## 2019-06-17 NOTE — Progress Notes (Signed)
CSW spoke with assigned CPS worker [place] Silos [phone]) via telephone.  CPS reported that will conduct a safety assessment with MOB and FOB today and will update CSW.  At this time there are barriers to infant discharging to MOB and FOB.   Laurey Arrow, MSW, LCSW Clinical Social Work [phone]

## 2019-06-18 NOTE — Telephone Encounter (Signed)
Opened in error

## 2019-06-23 ENCOUNTER — Ambulatory Visit (HOSPITAL_COMMUNITY): Payer: Medicaid Other

## 2019-06-23 ENCOUNTER — Encounter (HOSPITAL_COMMUNITY): Payer: Self-pay

## 2019-06-30 ENCOUNTER — Other Ambulatory Visit: Payer: Self-pay

## 2019-06-30 ENCOUNTER — Telehealth (INDEPENDENT_AMBULATORY_CARE_PROVIDER_SITE_OTHER): Payer: Medicaid Other | Admitting: Clinical

## 2019-06-30 DIAGNOSIS — Z658 Other specified problems related to psychosocial circumstances: Secondary | ICD-10-CM

## 2019-06-30 NOTE — Patient Instructions (Signed)
Transportation Resources [place] ([place]) [place], [place], [place] [address] https://www.[place].gov/departments/transportation/gdot-divisions/[place]-transit-agency-public-transportation-division     . Fixed-route bus services, including regional fare cards for PART, [place], [place], and [place] buses.  . Reduced fare bus ID's available for Medicaid, Medicare, and "orange card" recipients.  [place] SCAT offers curb-to-curb and door-to-door bus services for people with disabilities who are unable to use a fixed-bus route; also offers a shared-ride program.   Helpful tips:  -Routes available online and physical maps available at the main bus hub lobby (each for a specific route) -Smartphone directions often include bus routes (see the "bus" icon, next to the "car" and "walk" icons) -Routes differ on weekends, evenings and holidays, so plan ahead!  -If you have Medicaid, Medicare, or orange card, plan to obtain a reduced-fare ID to save 50% on rides. Check days and times to obtain an ID, and bring all necessary documents.   Department of Social [facility] [address] [place], [place] [address] [phone] www.ProfileWatcher.fi  **Medicaid transportation is available to [facility] recipients who need assistance getting to [facility] medical appointments and providers  [facility] [address] [place] [address], [place], [place] [address] www.[email or web address]  ** Offers non-emergency transportation for medical appointments   [place] [address], [place], [place] [address] [phone]   or  [email or web address]://james-[email or web address].[email or web address] **SNAP/EBT/ Other nutritional benefits  [facility] [address], [place], [place] [address] [phone]  or  https://palmer-smith.[email or web address]/ **WIC for  women who are pregnant and postpartum, infants and children up to 6 years old  [place] [address] [place], [place] [address] [phone]   or   www.theblessedtable.org  **Food pantry  [facility] [place], [place], [place] [address] [phone]   or   https://brotherkolbes.godaddysites.com  **Emergency food and prepared meals  [place] [address] [place], [place] [address] [phone]   or   www.cedargrovetop.us **Food pantry  [facility] [facility] [address] [place], [place] [address] [phone]   or   www.https://hartman-jones.net/ **Food pantry  [employer] [facility] [address], [place], [place] [address] [phone] **Food pantry  [facility] [address] [place], [place] [address] [phone]   or   www.greensborourbanministry.org  Insurance underwriter and prepared meals  [facility] [address] [place], [place], [place], [place] [address] [email or web address]  **Food pantry  [employer] [facility] [address] [place], [place] [address] [phone]   or   www.lbcnow.org  **Food pantry  [facility] [address], [place], [place] [address] [phone]   or   http://patterson-parker.net/ **Food pantry, nutrition education, gardening activities  [place] [address], [place], [place] [address] [phone] **Food pantry  [facility] [address] [place], [place] [address] [phone]   or   www.salvationarmyofgreensboro.org **Food pantry  [place] [address], [place],  [address] [phone]   or   www.stmattchurch.com  **Food pantry  [place] [address] [place],  [address] [phone]   or   vandaliapresbyterianchurch.org **Food pantry

## 2019-06-30 NOTE — BH Specialist Note (Signed)
Integrated Behavioral Health via Telemedicine Video Visit  06/30/2019 Kristina Dunn [id number]  Number of [place] visits: 2 Session Start time: 10:15Session End time: 10:36 Total time: 20 minutes  Referring Provider: Emeterio Reeve, MD Type of Visit: Video Patient/Family location: [facility] NICU  [facility] Provider location: [facility] All persons participating in visit: Patient Kristina Dunn and Kristina Dunn  Confirmed patient's address: Yes  Confirmed patient's phone number: Yes  Any changes to demographics: No   Confirmed patient's insurance: Yes  Any changes to patient's insurance: No   Discussed confidentiality: At previous visit  I connected with Kristina Dunn by a video enabled telemedicine application and verified that I am speaking with the correct person using two identifiers.     I discussed the limitations of evaluation and management by telemedicine and the availability of in person appointments.  I discussed that the purpose of this visit is to provide behavioral health care while limiting exposure to the novel coronavirus.   Discussed there is a possibility of technology failure and discussed alternative modes of communication if that failure occurs.  I discussed that engaging in this video visit, they consent to the provision of behavioral healthcare and the services will be billed under their insurance.  Patient and/or legal guardian expressed understanding and consented to video visit: Yes   PRESENTING CONCERNS: Patient and/or family reports the following symptoms/concerns: Pt states her primary concern today is life stress, as she went into labor at [place], baby is in NICU, was unable to obtain either EBT or WIC prior to childbirth, and "about 3 hours ago" FOB was rushed to hospital for emergency surgery for twisted intestine. Pt has transportation issues as well, and very open to learning about community resources today.   Duration of  problem: Today, most recent; Severity of problem: mild  STRENGTHS (Protective Factors/Coping Skills): Resiliency  GOALS ADDRESSED: Patient will: 1.  Reduce symptoms of: stress  2.  Increase knowledge and/or ability of: stress reduction  3.  Demonstrate ability to: Increase healthy adjustment to current life circumstances and Increase adequate support systems for patient/family  INTERVENTIONS: Interventions utilized:  Link to [employer] Standardized Assessments completed: Not Needed  ASSESSMENT: Patient currently experiencing Psychosocial stress.   Patient may benefit from community resources today.  PLAN: 1. Follow up with behavioral health clinician on : As needed 2. Behavioral recommendations:  -Obtain [place] reduced-fare bus ID -Go to DSS to check on EBT application error  -Contact WIC to complete application process -Consider additional community resources, as needed 3. Referral(s): [place] (In Clinic) and [employer] Resources:  Biomedical scientist  I discussed the assessment and treatment plan with the patient and/or parent/guardian. They were provided an opportunity to ask questions and all were answered. They agreed with the plan and demonstrated an understanding of the instructions.   They were advised to call back or seek an in-person evaluation if the symptoms worsen or if the condition fails to improve as anticipated.  Kristina Dunn Kristina Dunn

## 2019-07-07 ENCOUNTER — Encounter: Payer: Medicaid Other | Admitting: Advanced Practice Midwife

## 2019-07-14 ENCOUNTER — Telehealth (INDEPENDENT_AMBULATORY_CARE_PROVIDER_SITE_OTHER): Payer: Medicaid Other

## 2019-07-14 ENCOUNTER — Other Ambulatory Visit: Payer: Self-pay

## 2019-07-14 MED ORDER — NORGESTIMATE-ETH ESTRADIOL 0.25-35 MG-MCG PO TABS: 1.0000 | ORAL_TABLET | Freq: Every day | ORAL | 11 refills | Status: DC

## 2019-07-14 NOTE — Progress Notes (Signed)
TELEHEALTH POSTPARTUM VIRTUAL VIDEO VISIT ENCOUNTER NOTE   Provider [facility]   I connected with Kristina Dunn on 07/14/19 at  1:55 PM EDT by MyChart Video Encounter at home and verified that I am speaking with the correct person using two identifiers.    I discussed the limitations, risks, security and privacy concerns of performing an evaluation and management service virtually and the availability of in person appointments. I also discussed with the patient that there may be a patient responsible charge related to this service. The patient expressed understanding and agreed to proceed.  Chief Complaint: Postpartum Visit  History of Present Illness: Kristina Dunn is a 28 y.o. Caucasian [id number] being evaluated for postpartum followup.    She is s/p NSVD on at 34 weeks; she was discharged to home on  06/16/2019 Pregnancy complicated by preterm delivery. Baby is doing well.  Complains of n/a  Vaginal bleeding or discharge: Yes  Intercourse: No  Contraception: no method Mode of feeding infant: Bottle PP depression s/s: No .  Any bowel or bladder issues: No  Pap smear: no abnormalities (date: 05/26/2019)  Review of Systems: Positive for n/a. Her 12 point review of systems is negative or as noted in the History of Present Illness.  Patient Active Problem List   Diagnosis Date Noted  . Preterm labor 06/15/2019  . SVD (spontaneous vaginal delivery) 06/15/2019  . Supervision of low-risk pregnancy 05/18/2019  . Late prenatal care affecting pregnancy 05/18/2019    Medications Kristina Dunn had no medications administered during this visit. Current Outpatient Medications  Medication Sig Dispense Refill  . ibuprofen (ADVIL) 600 MG tablet Take 1 tablet (600 mg total) by mouth every 6 (six) hours. 30 tablet 0  . prenatal vitamin w/FE, FA (PRENATAL 1 + 1) 27-1 MG TABS tablet Take 1 tablet by mouth daily at 12 noon.     No current  facility-administered medications for this visit.     Allergies Acetaminophen  Physical Exam:  LMP 10/18/2018 (Exact Date)   General:  Alert, oriented and cooperative. Patient is in no acute distress.  Mental Status: Normal mood and affect. Normal behavior. Normal judgment and thought content.   Respiratory: Normal respiratory effort noted, no problems with respiration noted  Rest of physical exam deferred due to type of encounter  PP Depression Screening:   Edinburgh Postnatal Depression Scale Screening Tool 07/14/2019 06/16/2019  I have been able to laugh and see the funny side of things. 0 0  I have looked forward with enjoyment to things. 0 0  I have blamed myself unnecessarily when things went wrong. 0 0  I have been anxious or worried for no good reason. 0 1  I have felt scared or panicky for no good reason. 0 1  Things have been getting on top of me. 0 1  I have been so unhappy that I have had difficulty sleeping. 0 1  I have felt sad or miserable. 0 1  I have been so unhappy that I have been crying. 0 1  The thought of harming myself has occurred to me. 0 0  Edinburgh Postnatal Depression Scale Total 0 6     Assessment:Patient is a 28 y.o. [id number] who is 4 weeks postpartum from a normal spontaneous vaginal delivery.  She is doing well.   Plan: 1. Postpartum care and examination -Patient doing well without complaints -Desires OCPs for contraception. Discussed all options at length and still wants the  pill. Instructed patient to start at 6w pp.    RTC 1 year for annual exam or sooner if needed  I discussed the assessment and treatment plan with the patient. The patient was provided an opportunity to ask questions and all were answered. The patient agreed with the plan and demonstrated an understanding of the instructions.   The patient was advised to call back or seek an in-person evaluation/go to the ED for any concerning postpartum symptoms.  I provided 15 minutes of  face-to-face time during this encounter.   Wende Mott, [place] for [employer], [place]

## 2019-07-20 NOTE — Discharge Summary (Signed)
Postpartum Discharge Summary     Patient Name: Kristina Dunn DOB: [birth year] MRN: [id number]  Date of admission: 06/15/2019 Delivering Provider: Arvilla MarketWALLACE, CATHERINE LAUREN   Date of discharge: 07/20/2019  Admitting diagnosis: Stomach Pain possible CTX  Intrauterine pregnancy: 938w2d     Secondary diagnosis:  Active Problems:   * No active hospital problems. *  Additional problems: premature delivery     Discharge diagnosis: Preterm Pregnancy Delivered                                                                                                Post partum procedures:none  Augmentation: none  Complications: None  Hospital course:  Onset of Labor With Vaginal Delivery     28 y.o. yo [id number] at 5338w2d was admitted in Active Labor on 06/15/2019. Patient had an uncomplicated labor course as follows:  Membrane Rupture Time/Date: 8:00 PM ,06/14/2019   Intrapartum Procedures: Episiotomy: None [1]                                         Lacerations:  None [1]  Patient had a delivery of a Viable infant. 06/15/2019  Information for the patient's newborn:  Massie Kluverearson, Jasper Ray [[id number]][[id number]]  Delivery Method: Vaginal, Spontaneous(Filed from Delivery Summary)     Pateint had an uncomplicated postpartum course.  She is ambulating, tolerating a regular diet, passing flatus, and urinating well. Patient is discharged home in stable condition on 07/20/19.   Magnesium Sulfate recieved: No BMZ received: No  Physical exam  Vitals:   06/16/19 1555 06/16/19 1940 06/16/19 2352 06/17/19 0847  BP: 134/88 130/88 (!) 132/97 118/89  Pulse: 92 89 75 63  Resp: 18 18 18 18   Temp: 98.3 F (36.8 C) 98 F (36.7 C) 98.1 F (36.7 C) 97.7 F (36.5 C)  TempSrc: Oral Oral Oral Oral  SpO2: 98% 98% 100% 100%  Weight:      Height:       General: alert, cooperative and no distress Lochia: appropriate Uterine Fundus: firm Incision: N/A DVT Evaluation: No evidence of DVT seen on physical exam. Labs: Lab  Results  Component Value Date   WBC 12.7 (H) 06/15/2019   HGB 11.0 (L) 06/15/2019   HCT 34.7 (L) 06/15/2019   MCV 85.7 06/15/2019   PLT 319 06/15/2019   CMP Latest Ref Rng & Units 06/15/2019  Glucose 70 - 99 mg/dL 70  BUN 6 - 20 mg/dL 6  Creatinine 1.300.44 - 8.651.00 mg/dL 7.840.79  Sodium 696135 - 295145 mmol/L 137  Potassium 3.5 - 5.1 mmol/L 3.9  Chloride 98 - 111 mmol/L 110  CO2 22 - 32 mmol/L 18(L)  Calcium 8.9 - 10.3 mg/dL 2.8(U8.4(L)  Total Protein 6.5 - 8.1 g/dL 6.2(L)  Total Bilirubin 0.3 - 1.2 mg/dL 0.4  Alkaline Phos 38 - 126 U/L 272(H)  AST 15 - 41 U/L 26  ALT 0 - 44 U/L 15    Discharge instruction: per After Visit Summary and "Baby and Me Booklet".  After visit meds:  Allergies as of 06/17/2019      Reactions   Acetaminophen Nausea And Vomiting   And causes severe abdominal pain      Medication List    TAKE these medications   ibuprofen 600 MG tablet Commonly known as: ADVIL Take 1 tablet (600 mg total) by mouth every 6 (six) hours.   prenatal vitamin w/FE, FA 27-1 MG Tabs tablet Take 1 tablet by mouth daily at 12 noon.       Diet: routine diet  Activity: Advance as tolerated. Pelvic rest for 6 weeks.   Outpatient follow up:4 weeks Follow up Appt:No future appointments. Follow up Visit: [place] for [facility] Follow up in 4 week(s).   Specialty: Obstetrics and Gynecology Contact information: [place] [address] [place], [place] [id number] [place] [id number] [phone]           Please schedule this patient for Postpartum visit in: 4 weeks with the following provider: Any provider For C/S patients schedule nurse incision check in weeks 2 weeks: no High risk pregnancy complicated by: preterm labor and delivery Delivery mode:  SVD Anticipated Birth Control:  other/unsure PP Procedures needed: none  Schedule Integrated BH visit: no      Newborn Data: Live born female  Birth Weight:  5 lb 9.2 oz (2530 g) APGAR: 8, 9  Newborn Delivery   Birth date/time: 06/15/2019 21:49:00 Delivery type: Vaginal, Spontaneous      Baby Feeding: Breast Disposition:home with mother   07/20/2019 Emeterio Reeve, MD

## 2020-06-17 ENCOUNTER — Other Ambulatory Visit: Payer: Self-pay

## 2020-06-23 ENCOUNTER — Other Ambulatory Visit: Payer: Self-pay | Admitting: Lactation Services

## 2020-06-23 MED ORDER — NORGESTIMATE-ETH ESTRADIOL 0.25-35 MG-MCG PO TABS: 1.0000 | ORAL_TABLET | Freq: Every day | ORAL | 11 refills | Status: DC

## 2020-06-23 NOTE — Progress Notes (Signed)
Patient called for refill on OCP's. Ordered 3 months per protocol and front desk to schedule for annual .

## 2020-07-11 ENCOUNTER — Other Ambulatory Visit: Payer: Self-pay

## 2020-07-11 ENCOUNTER — Encounter (HOSPITAL_COMMUNITY): Payer: Self-pay | Admitting: Emergency Medicine

## 2020-07-11 ENCOUNTER — Ambulatory Visit (HOSPITAL_COMMUNITY)
Admission: EM | Admit: 2020-07-11 | Discharge: 2020-07-11 | Disposition: A | Discharge status: Home / Self Care | Payer: Medicaid Other | Attending: Family Medicine | Admitting: Family Medicine

## 2020-07-11 DIAGNOSIS — Z3202 Encounter for pregnancy test, result negative: Secondary | ICD-10-CM | POA: Diagnosis not present

## 2020-07-11 DIAGNOSIS — N390 Urinary tract infection, site not specified: Secondary | ICD-10-CM | POA: Diagnosis not present

## 2020-07-11 DIAGNOSIS — N3001 Acute cystitis with hematuria: Secondary | ICD-10-CM | POA: Insufficient documentation

## 2020-07-11 LAB — POCT URINALYSIS DIPSTICK, ED / UC
Bilirubin Urine: NEGATIVE
Glucose, UA: NEGATIVE mg/dL
Ketones, ur: NEGATIVE mg/dL
Nitrite: NEGATIVE
Protein, ur: 30 mg/dL — AB
Specific Gravity, Urine: 1.015 (ref 1.005–1.030)
Urobilinogen, UA: 0.2 mg/dL (ref 0.0–1.0)
pH: 7 (ref 5.0–8.0)

## 2020-07-11 LAB — POC URINE PREG, ED: Preg Test, Ur: NEGATIVE

## 2020-07-11 MED ORDER — PHENAZOPYRIDINE HCL 200 MG PO TABS
200.0000 mg | ORAL_TABLET | Freq: Three times a day (TID) | ORAL | 0 refills | Status: DC
Start: 2020-07-11 — End: 2020-07-29

## 2020-07-11 MED ORDER — NITROFURANTOIN MONOHYD MACRO 100 MG PO CAPS
100.0000 mg | ORAL_CAPSULE | Freq: Two times a day (BID) | ORAL | 0 refills | Status: DC
Start: 2020-07-11 — End: 2020-07-29

## 2020-07-11 NOTE — Discharge Instructions (Addendum)
Take antibiotic 2 times a day Drink plenty of water Take Pyridium 3 times a day Pyridium will take care of the urine pain, but will make your urine orange You will see improvement pretty quickly over the next 24 hours Return as needed

## 2020-07-11 NOTE — ED Triage Notes (Signed)
This morning, patient started urinating frequently.  Reports painful urination.  Denies back or abdominal pain.  Patient noticed blood in urine.

## 2020-07-11 NOTE — ED Provider Notes (Signed)
[facility]    CSN: [id number] Arrival date & time: 07/11/20  1310      History   Chief Complaint Chief Complaint  Patient presents with  . Urinary Tract Infection    HPI Kristina Dunn is a 29 y.o. female.   HPI  Patient has dysuria frequency and hematuria since yesterday No fever or chills No abdominal Pain no flank pain No history of kidney stones or kidney infections  Past Medical History:  Diagnosis Date  . Medical history non-contributory     There are no problems to display for this patient.   Past Surgical History:  Procedure Laterality Date  . WISDOM TOOTH EXTRACTION      OB History    Gravida  4   Para  3   Term  2   Preterm  1   AB  1   Living  3     SAB  0   TAB  1   Ectopic  0   Multiple  0   Live Births  3            Home Medications    Prior to Admission medications   Medication Sig Start Date End Date Taking? Authorizing Provider  norgestimate-ethinyl estradiol (ORTHO-CYCLEN) 0.25-35 MG-MCG tablet Take 1 tablet by mouth daily.   Yes [provider]  ibuprofen (ADVIL) 600 MG tablet Take 1 tablet (600 mg total) by mouth every 6 (six) hours. 06/17/19   Adam Phenix, MD  nitrofurantoin, macrocrystal-monohydrate, (MACROBID) 100 MG capsule Take 1 capsule (100 mg total) by mouth 2 (two) times daily. 07/11/20   Eustace Moore, MD  phenazopyridine (PYRIDIUM) 200 MG tablet Take 1 tablet (200 mg total) by mouth 3 (three) times daily. 07/11/20   Eustace Moore, MD  prenatal vitamin w/FE, FA (PRENATAL 1 + 1) 27-1 MG TABS tablet Take 1 tablet by mouth daily at 12 noon.    [provider]    Family History Family History  Problem Relation Age of Onset  . Cancer Mother   . Diabetes Father     Social History Social History   Tobacco Use  . Smoking status: Current Every Day Smoker    Packs/day: 0.50    Types: Cigarettes  . Smokeless tobacco: Never Used  Vaping Use  . Vaping Use: Never used   Substance Use Topics  . Alcohol use: Yes  . Drug use: Not Currently     Allergies   Acetaminophen   Review of Systems Review of Systems See HPI Physical Exam Triage Vital Signs ED Triage Vitals  Enc Vitals Group     BP 07/11/20 1457 122/83     Pulse Rate 07/11/20 1457 75     Resp 07/11/20 1457 18     Temp 07/11/20 1457 98 F (36.7 C)     Temp Source 07/11/20 1457 Oral     SpO2 07/11/20 1457 100 %     Weight --      Height --      Head Circumference --      Peak Flow --      Pain Score 07/11/20 1453 6     Pain Loc --      Pain Edu? --      Excl. in GC? --    No data found.  Updated Vital Signs BP 122/83 (BP Location: Left Arm)   Pulse 75   Temp 98 F (36.7 C) (Oral)   Resp 18  LMP 07/02/2020   SpO2 100%        Physical Exam Constitutional:      General: She is not in acute distress.    Appearance: Normal appearance. She is well-developed and normal weight.  HENT:     Head: Normocephalic and atraumatic.     Mouth/Throat:     Comments: Mask in place Eyes:     Conjunctiva/sclera: Conjunctivae normal.     Pupils: Pupils are equal, round, and reactive to light.  Cardiovascular:     Rate and Rhythm: Normal rate.  Pulmonary:     Effort: Pulmonary effort is normal. No respiratory distress.  Abdominal:     General: There is no distension.     Palpations: Abdomen is soft.     Tenderness: There is no right CVA tenderness or left CVA tenderness.  Musculoskeletal:        General: Normal range of motion.     Cervical back: Normal range of motion.  Skin:    General: Skin is warm and dry.  Neurological:     Mental Status: She is alert.  Psychiatric:        Behavior: Behavior normal.      UC Treatments / Results  Labs (all labs ordered are listed, but only abnormal results are displayed) Labs Reviewed  POCT URINALYSIS DIPSTICK, ED / UC - Abnormal; Notable for the following components:      Result Value   Hgb urine dipstick MODERATE (*)    Protein,  ur 30 (*)    Leukocytes,Ua MODERATE (*)    All other components within normal limits  POC URINE PREG, ED    EKG   Radiology No results found.  Procedures Procedures (including critical care time)  Medications Ordered in UC Medications - No data to display  Initial Impression / Assessment and Plan / UC Course  I have reviewed the triage vital signs and the nursing notes.  Pertinent labs & imaging results that were available during my care of the patient were reviewed by me and considered in my medical decision making (see chart for details).      Final Clinical Impressions(s) / UC Diagnoses   Final diagnoses:  Acute cystitis with hematuria     Discharge Instructions     Take antibiotic 2 times a day Drink plenty of water Take Pyridium 3 times a day Pyridium will take care of the urine pain, but will make your urine orange You will see improvement pretty quickly over the next 24 hours Return as needed   ED Prescriptions    Medication Sig Dispense Auth. Provider   nitrofurantoin, macrocrystal-monohydrate, (MACROBID) 100 MG capsule Take 1 capsule (100 mg total) by mouth 2 (two) times daily. 10 capsule Eustace Moore, MD   phenazopyridine (PYRIDIUM) 200 MG tablet Take 1 tablet (200 mg total) by mouth 3 (three) times daily. 6 tablet Eustace Moore, MD     PDMP not reviewed this encounter.   Eustace Moore, MD 07/11/20 [phone]

## 2020-07-13 LAB — URINE CULTURE
Culture: 30000 — AB
Special Requests: NORMAL

## 2020-07-21 ENCOUNTER — Ambulatory Visit (INDEPENDENT_AMBULATORY_CARE_PROVIDER_SITE_OTHER): Payer: Medicaid Other

## 2020-07-21 ENCOUNTER — Other Ambulatory Visit: Payer: Self-pay

## 2020-07-21 DIAGNOSIS — Z0000 Encounter for general adult medical examination without abnormal findings: Secondary | ICD-10-CM

## 2020-07-21 NOTE — Progress Notes (Signed)
Patient with previous script for [facility] pills.  Nurse reviewed and sent home prior to provider assessment.

## 2020-07-29 ENCOUNTER — Ambulatory Visit (HOSPITAL_COMMUNITY)
Admission: EM | Admit: 2020-07-29 | Discharge: 2020-07-29 | Disposition: A | Discharge status: Home / Self Care | Payer: Medicaid Other | Attending: Internal Medicine | Admitting: Internal Medicine

## 2020-07-29 ENCOUNTER — Other Ambulatory Visit: Payer: Self-pay

## 2020-07-29 ENCOUNTER — Encounter (HOSPITAL_COMMUNITY): Payer: Self-pay

## 2020-07-29 DIAGNOSIS — N390 Urinary tract infection, site not specified: Secondary | ICD-10-CM

## 2020-07-29 DIAGNOSIS — M549 Dorsalgia, unspecified: Secondary | ICD-10-CM | POA: Diagnosis not present

## 2020-07-29 DIAGNOSIS — Z3202 Encounter for pregnancy test, result negative: Secondary | ICD-10-CM

## 2020-07-29 DIAGNOSIS — R300 Dysuria: Secondary | ICD-10-CM | POA: Diagnosis not present

## 2020-07-29 LAB — POCT URINALYSIS DIPSTICK, ED / UC
Bilirubin Urine: NEGATIVE
Glucose, UA: NEGATIVE mg/dL
Ketones, ur: NEGATIVE mg/dL
Nitrite: NEGATIVE
Protein, ur: 100 mg/dL — AB
Specific Gravity, Urine: 1.025 (ref 1.005–1.030)
Urobilinogen, UA: 0.2 mg/dL (ref 0.0–1.0)
pH: 7.5 (ref 5.0–8.0)

## 2020-07-29 LAB — POC URINE PREG, ED: Preg Test, Ur: NEGATIVE

## 2020-07-29 MED ORDER — PHENAZOPYRIDINE HCL 200 MG PO TABS
200.0000 mg | ORAL_TABLET | Freq: Three times a day (TID) | ORAL | 0 refills | Status: DC
Start: 2020-07-29 — End: 2022-02-23

## 2020-07-29 MED ORDER — CEPHALEXIN 500 MG PO CAPS: 500.0000 mg | ORAL_CAPSULE | Freq: Two times a day (BID) | ORAL | 0 refills | Status: AC

## 2020-07-29 NOTE — Discharge Instructions (Signed)
Urine showed evidence of infection. We are treating you with keflex- twice daily for 1 week. Be sure to take full course. Stay hydrated- urine should be pale yellow to clear.   Please return or follow up with your primary provider if symptoms not improving with treatment. Please return sooner if you have worsening of symptoms or develop fever, nausea, vomiting, abdominal pain, back pain, lightheadedness, dizziness. 

## 2020-07-29 NOTE — ED Triage Notes (Signed)
Pt presents with burning sensation when urinating, blood in the urine and lower back pain x 1 day. Denies nausea, fever, chills.

## 2020-07-29 NOTE — ED Provider Notes (Signed)
[facility]    CSN: [id number] Arrival date & time: 07/29/20  1637      History   Chief Complaint Chief Complaint  Patient presents with  . Dysuria  . Back Pain    HPI Kristina Dunn is a 29 y.o. female presenting today for evaluation of possible UTI.  Patient reports over the past days she has had dysuria, hematuria as well as lower back pain.  Denies fevers chills nausea or vomiting.  Last menstrual cycle was 09/01.  Recently was seen here on 8/16, approximately 2 weeks ago for similar symptoms and had culture growing 30,000 colonies of E. coli, resistant to Bactrim and ampicillin.  Symptoms slightly worse this time.  HPI  Past Medical History:  Diagnosis Date  . Medical history non-contributory     There are no problems to display for this patient.   Past Surgical History:  Procedure Laterality Date  . WISDOM TOOTH EXTRACTION      OB History    Gravida  4   Para  3   Term  2   Preterm  1   AB  1   Living  3     SAB  0   TAB  1   Ectopic  0   Multiple  0   Live Births  3            Home Medications    Prior to Admission medications   Medication Sig Start Date End Date Taking? Authorizing Provider  Multiple Vitamin (MULTIVITAMIN) tablet Take 1 tablet by mouth daily.   Yes [provider]  cephALEXin (KEFLEX) 500 MG capsule Take 1 capsule (500 mg total) by mouth 2 (two) times daily for 7 days. 07/29/20 08/05/20  Korby Ratay C, PA-C  ibuprofen (ADVIL) 600 MG tablet Take 1 tablet (600 mg total) by mouth every 6 (six) hours. 06/17/19   Adam Phenix, MD  norgestimate-ethinyl estradiol (ORTHO-CYCLEN) 0.25-35 MG-MCG tablet Take 1 tablet by mouth daily.    [provider]  phenazopyridine (PYRIDIUM) 200 MG tablet Take 1 tablet (200 mg total) by mouth 3 (three) times daily. 07/29/20   Aariya Ferrick, Junius Creamer, PA-C    Family History Family History  Problem Relation Age of Onset  . Cancer Mother   . Diabetes Father      Social History Social History   Tobacco Use  . Smoking status: Current Every Day Smoker    Packs/day: 0.50    Types: Cigarettes  . Smokeless tobacco: Never Used  Vaping Use  . Vaping Use: Never used  Substance Use Topics  . Alcohol use: Yes  . Drug use: Not Currently     Allergies   Acetaminophen   Review of Systems Review of Systems  Constitutional: Negative for fever.  Respiratory: Negative for shortness of breath.   Cardiovascular: Negative for chest pain.  Gastrointestinal: Negative for abdominal pain, diarrhea, nausea and vomiting.  Genitourinary: Positive for dysuria, frequency and hematuria. Negative for flank pain, genital sores, menstrual problem, vaginal bleeding, vaginal discharge and vaginal pain.  Musculoskeletal: Positive for back pain.  Skin: Negative for rash.  Neurological: Negative for dizziness, light-headedness and headaches.     Physical Exam Triage Vital Signs ED Triage Vitals  Enc Vitals Group     BP      Pulse      Resp      Temp      Temp src      SpO2  Weight      Height      Head Circumference      Peak Flow      Pain Score      Pain Loc      Pain Edu?      Excl. in GC?    No data found.  Updated Vital Signs BP 128/82 (BP Location: Right Arm)   Pulse 80   Temp 98.9 F (37.2 C) (Oral)   Resp 17   LMP 07/27/2020   SpO2 96%   Visual Acuity Right Eye Distance:   Left Eye Distance:   Bilateral Distance:    Right Eye Near:   Left Eye Near:    Bilateral Near:     Physical Exam Vitals and nursing note reviewed.  Constitutional:      Appearance: She is well-developed.     Comments: No acute distress  HENT:     Head: Normocephalic and atraumatic.     Nose: Nose normal.  Eyes:     Conjunctiva/sclera: Conjunctivae normal.  Cardiovascular:     Rate and Rhythm: Normal rate.  Pulmonary:     Effort: Pulmonary effort is normal. No respiratory distress.  Abdominal:     General: There is no distension.      Comments:  tender to palpation over middle lower abdomen and suprapubic area, negative rebound positive CVA tenderness on the right side  Musculoskeletal:        General: Normal range of motion.     Cervical back: Neck supple.  Skin:    General: Skin is warm and dry.  Neurological:     Mental Status: She is alert and oriented to person, place, and time.      UC Treatments / Results  Labs (all labs ordered are listed, but only abnormal results are displayed) Labs Reviewed  POCT URINALYSIS DIPSTICK, ED / UC - Abnormal; Notable for the following components:      Result Value   Hgb urine dipstick MODERATE (*)    Protein, ur 100 (*)    Leukocytes,Ua TRACE (*)    All other components within normal limits  URINE CULTURE  POC URINE PREG, ED    EKG   Radiology No results found.  Procedures Procedures (including critical care time)  Medications Ordered in UC Medications - No data to display  Initial Impression / Assessment and Plan / UC Course  I have reviewed the triage vital signs and the nursing notes.  Pertinent labs & imaging results that were available during my care of the patient were reviewed by me and considered in my medical decision making (see chart for details).    Treating for UTI- keflex BID x 7 days, Culture pending. Pregnancy negative. Pyridium for discomfort. Push fluids.   Discussed strict return precautions. Patient verbalized understanding and is agreeable with plan.   Final Clinical Impressions(s) / UC Diagnoses   Final diagnoses:  Lower urinary tract infectious disease     Discharge Instructions     Urine showed evidence of infection. We are treating you with keflex- twice daily for 1 week. Be sure to take full course. Stay hydrated- urine should be pale yellow to clear.   Please return or follow up with your primary provider if symptoms not improving with treatment. Please return sooner if you have worsening of symptoms or develop fever,  nausea, vomiting, abdominal pain, back pain, lightheadedness, dizziness.    ED Prescriptions    Medication Sig Dispense Auth. Provider   phenazopyridine (  PYRIDIUM) 200 MG tablet Take 1 tablet (200 mg total) by mouth 3 (three) times daily. 6 tablet Alanna Storti C, PA-C   cephALEXin (KEFLEX) 500 MG capsule Take 1 capsule (500 mg total) by mouth 2 (two) times daily for 7 days. 14 capsule Maryfrances Portugal, [place] C, PA-C     PDMP not reviewed this encounter.   Lew Dawes, [place] 07/29/20 1746

## 2020-08-01 LAB — URINE CULTURE: Culture: >=100000 — AB

## 2020-08-12 ENCOUNTER — Other Ambulatory Visit: Payer: Self-pay

## 2020-08-12 ENCOUNTER — Emergency Department (HOSPITAL_COMMUNITY): Payer: Medicaid Other

## 2020-08-12 ENCOUNTER — Encounter (HOSPITAL_COMMUNITY): Payer: Self-pay | Admitting: Emergency Medicine

## 2020-08-12 ENCOUNTER — Emergency Department (HOSPITAL_COMMUNITY)
Admission: EM | Admit: 2020-08-12 | Discharge: 2020-08-13 | Disposition: A | Discharge status: Home / Self Care | Payer: Medicaid Other | Attending: Emergency Medicine | Admitting: Emergency Medicine

## 2020-08-12 DIAGNOSIS — R112 Nausea with vomiting, unspecified: Secondary | ICD-10-CM

## 2020-08-12 DIAGNOSIS — R1084 Generalized abdominal pain: Secondary | ICD-10-CM | POA: Diagnosis present

## 2020-08-12 DIAGNOSIS — Z20822 Contact with and (suspected) exposure to covid-19: Secondary | ICD-10-CM | POA: Insufficient documentation

## 2020-08-12 DIAGNOSIS — F17210 Nicotine dependence, cigarettes, uncomplicated: Secondary | ICD-10-CM | POA: Diagnosis not present

## 2020-08-12 DIAGNOSIS — R197 Diarrhea, unspecified: Secondary | ICD-10-CM | POA: Diagnosis not present

## 2020-08-12 DIAGNOSIS — R109 Unspecified abdominal pain: Secondary | ICD-10-CM

## 2020-08-12 LAB — CBC WITH DIFFERENTIAL/PLATELET
Abs Immature Granulocytes: 0.01 10*3/uL (ref 0.00–0.07)
Basophils Absolute: 0 10*3/uL (ref 0.0–0.1)
Basophils Relative: 1 %
Eosinophils Absolute: 0.1 10*3/uL (ref 0.0–0.5)
Eosinophils Relative: 2 %
HCT: 40.4 % (ref 36.0–46.0)
Hemoglobin: 13.4 g/dL (ref 12.0–15.0)
Immature Granulocytes: 0 %
Lymphocytes Relative: 23 %
Lymphs Abs: 0.9 10*3/uL (ref 0.7–4.0)
MCH: 31.7 pg (ref 26.0–34.0)
MCHC: 33.2 g/dL (ref 30.0–36.0)
MCV: 95.5 fL (ref 80.0–100.0)
Monocytes Absolute: 0.3 10*3/uL (ref 0.1–1.0)
Monocytes Relative: 7 %
Neutro Abs: 2.6 10*3/uL (ref 1.7–7.7)
Neutrophils Relative %: 67 %
Platelets: 188 10*3/uL (ref 150–400)
RBC: 4.23 MIL/uL (ref 3.87–5.11)
RDW: 12 % (ref 11.5–15.5)
WBC: 3.8 10*3/uL — ABNORMAL LOW (ref 4.0–10.5)
nRBC: 0 % (ref 0.0–0.2)

## 2020-08-12 LAB — URINALYSIS, ROUTINE W REFLEX MICROSCOPIC
Bilirubin Urine: NEGATIVE
Glucose, UA: NEGATIVE mg/dL
Hgb urine dipstick: NEGATIVE
Ketones, ur: NEGATIVE mg/dL
Nitrite: NEGATIVE
Protein, ur: NEGATIVE mg/dL
Specific Gravity, Urine: 1.015 (ref 1.005–1.030)
pH: 5 (ref 5.0–8.0)

## 2020-08-12 LAB — I-STAT BETA HCG BLOOD, ED (MC, WL, AP ONLY): I-stat hCG, quantitative: <5 m[IU]/mL (ref <5)

## 2020-08-12 LAB — COMPREHENSIVE METABOLIC PANEL
ALT: 18 U/L (ref 0–44)
AST: 25 U/L (ref 15–41)
Albumin: 3.7 g/dL (ref 3.5–5.0)
Alkaline Phosphatase: 33 U/L — ABNORMAL LOW (ref 38–126)
Anion gap: 9 (ref 5–15)
BUN: 11 mg/dL (ref 6–20)
CO2: 25 mmol/L (ref 22–32)
Calcium: 8.4 mg/dL — ABNORMAL LOW (ref 8.9–10.3)
Chloride: 104 mmol/L (ref 98–111)
Creatinine, Ser: 0.91 mg/dL (ref 0.44–1.00)
GFR calc Af Amer: >60 mL/min (ref >60)
GFR calc non Af Amer: >60 mL/min (ref >60)
Glucose, Bld: 88 mg/dL (ref 70–99)
Potassium: 3.7 mmol/L (ref 3.5–5.1)
Sodium: 138 mmol/L (ref 135–145)
Total Bilirubin: 0.4 mg/dL (ref 0.3–1.2)
Total Protein: 6.4 g/dL — ABNORMAL LOW (ref 6.5–8.1)

## 2020-08-12 LAB — LIPASE, BLOOD: Lipase: 37 U/L (ref 11–51)

## 2020-08-12 NOTE — ED Triage Notes (Signed)
Pt c/o abdominal pain and constant nausea x 1 week. C/o diarrhea that started this am. Reports she just finished abx for UTI.

## 2020-08-12 NOTE — ED Triage Notes (Signed)
Emergency Medicine Provider Triage Evaluation Note  Kristina Dunn , a 29 y.o. female  was evaluated in triage.  Pt complains of abdominal pain, nausea, loss of appetite.  Patient states her symptoms began 5 days ago.  They are gradually worsening.  She has fevers, chills, vomiting, diarrhea, constipation.  She recently finished a course of antibiotics, finished 1 week ago.  That was for UTI, the symptoms have resolved.  She reports no other medical problems, takes no medications daily.  Review of Systems  Positive: Right-sided abdominal pain.  Nausea.  Decreased appetite. Negative: Fever, vomiting, diarrhea, constipation  Physical Exam  BP (!) 141/95 (BP Location: Right Arm)   Pulse 82   Temp 98.8 F (37.1 C) (Oral)   Resp 20   LMP 07/27/2020   SpO2 100%  Gen:   Awake, no distress   HEENT:  Atraumatic  Resp:  Normal effort  Cardiac:  Normal rate  Abd:   ttp fo the abd, worse in the RUQ.  MSK:   Moves extremities without difficulty  Neuro:  Speech clear   Medical Decision Making  Medically screening exam initiated at 8:23 PM.  Appropriate orders placed.  Kristina Dunn was informed that the remainder of the evaluation will be completed by another provider, this initial triage assessment does not replace that evaluation, and the importance of remaining in the ED until their evaluation is complete.  Clinical Impression   Abdominal pain, nausea, decreased appetite.  Worsened pain in the right upper quadrant.  Consider gallbladder etiology.  Will obtain labs and right upper quadrant ultrasound.   Alveria Apley, PA-C 08/12/20 2024

## 2020-08-13 LAB — SARS CORONAVIRUS 2 BY RT PCR (HOSPITAL ORDER, PERFORMED IN ~~LOC~~ HOSPITAL LAB): SARS Coronavirus 2: NEGATIVE

## 2020-08-13 MED ORDER — OMEPRAZOLE 40 MG PO CPDR
40.0000 mg | DELAYED_RELEASE_CAPSULE | Freq: Every day | ORAL | 0 refills | Status: DC
Start: 2020-08-13 — End: 2022-08-31

## 2020-08-13 MED ORDER — PROMETHAZINE HCL 25 MG PO TABS
25.0000 mg | ORAL_TABLET | Freq: Four times a day (QID) | ORAL | 0 refills | Status: DC | PRN
Start: 2020-08-13 — End: 2022-02-23

## 2020-08-13 MED ORDER — SODIUM CHLORIDE 0.9 % IV BOLUS
1000.0000 mL | Freq: Once | INTRAVENOUS | Status: AC
  Administered 2020-08-13: 1000 mL via INTRAVENOUS

## 2020-08-13 MED ORDER — ONDANSETRON HCL 4 MG/2ML IJ SOLN
4.0000 mg | Freq: Once | INTRAMUSCULAR | Status: AC
  Administered 2020-08-13: 4 mg via INTRAVENOUS
  Filled 2020-08-13: qty 2

## 2020-08-13 MED ORDER — MORPHINE SULFATE (PF) 4 MG/ML IV SOLN
4.0000 mg | Freq: Once | INTRAVENOUS | Status: AC
  Administered 2020-08-13: 4 mg via INTRAVENOUS
  Filled 2020-08-13: qty 1

## 2020-08-13 NOTE — Discharge Instructions (Addendum)
You were seen in the ER for nausea, vomiting, decreased appetite, generalized abdominal pain, diarrhea  Lab work today was reassuring.  Right upper quadrant abdominal ultrasound did not show any abnormalities.  Urinalysis shows improvement in infection.  Your symptoms improved after medicines.  Cause of your symptoms is likely a virus or gastritis.  Antibiotics can cause stomach upset and diarrhea.  I recommend Phenergan 25 mg every 6 hours for the next 72 hours to prevent nausea and help hydrate, eat.  Take omeprazole as an antiacid medicine every morning on empty stomach 20 to 30 minutes before eating or drinking.  Gentle diet until you start to feel better.  500 to 1000 mg of acetaminophen for pain.  Avoid ibuprofen or aspirin containing products.  Return immediately to the ER if you have worsening persistent symptoms despite above medication regimen, fever greater than 100.4, blood in your vomit or stool, localized abdominal pain in your right upper quadrant or right lower quadrant, any urinary symptoms.

## 2020-08-13 NOTE — ED Provider Notes (Signed)
[facility] EMERGENCY DEPARTMENT Provider Note   CSN: [id number] Arrival date & time: 08/12/20  1951     History Chief Complaint  Patient presents with  . Abdominal Pain    Kristina Dunn is a 29 y.o. female with history of recent UTI after Keflex presents to the ER for evaluation of abdominal pain for the last 6 days.  Pain is a 6/10, constant, generalized.  States no particular area hurts her more than others.  Sometimes her epigastrium hurts and then the pain is in the lower abdomen.  Has been progressively worsening.  Has associated constant, severe nausea.  Has vomited twice.  Has minimal to no appetite.  States just thinking about food makes her very nauseous.  Diarrhea began yesterday.  Reports a cough that is not new and that she attributes to smoking, dry.  No fever, chills, congestion, sore throat, chest pain or shortness of breath.  States in less than 1 month she had 2 urinary tract infections.  The first time she had hematuria and dysuria and treated with Macrobid.  Her symptoms resolved completely for a week but returned 2 weeks later and had right flank pain with it.  She was prescribed Keflex and her symptoms have since resolved.  No history of kidney stones.  No sick contacts.  She has children but they are well.  No suspicious food intake.  On vaccinated for COVID.  States she has been taking yogurt during her antibiotic course to prevent diarrhea.  Denies previous history of GI issues.  No abdominal surgeries.  HPI     Past Medical History:  Diagnosis Date  . Medical history non-contributory     There are no problems to display for this patient.   Past Surgical History:  Procedure Laterality Date  . WISDOM TOOTH EXTRACTION       OB History    Gravida  4   Para  3   Term  2   Preterm  1   AB  1   Living  3     SAB  0   TAB  1   Ectopic  0   Multiple  0   Live Births  3           Family History  Problem Relation Age of  Onset  . Cancer Mother   . Diabetes Father     Social History   Tobacco Use  . Smoking status: Current Every Day Smoker    Packs/day: 0.50    Types: Cigarettes  . Smokeless tobacco: Never Used  Vaping Use  . Vaping Use: Never used  Substance Use Topics  . Alcohol use: Yes  . Drug use: Not Currently    Home Medications Prior to Admission medications   Medication Sig Start Date End Date Taking? Authorizing Provider  ibuprofen (ADVIL) 600 MG tablet Take 1 tablet (600 mg total) by mouth every 6 (six) hours. 06/17/19   Adam Phenix, MD  Multiple Vitamin (MULTIVITAMIN) tablet Take 1 tablet by mouth daily.    [provider]  norgestimate-ethinyl estradiol (ORTHO-CYCLEN) 0.25-35 MG-MCG tablet Take 1 tablet by mouth daily.    [provider]  omeprazole (PRILOSEC) 40 MG capsule Take 1 capsule (40 mg total) by mouth daily. 08/13/20 09/12/20  Liberty Handy, PA-C  phenazopyridine (PYRIDIUM) 200 MG tablet Take 1 tablet (200 mg total) by mouth 3 (three) times daily. 07/29/20   Wieters, Hallie C, PA-C  promethazine (PHENERGAN) 25 MG tablet  Take 1 tablet (25 mg total) by mouth every 6 (six) hours as needed for nausea or vomiting. 08/13/20   Liberty Handy, PA-C    Allergies    Acetaminophen  Review of Systems   Review of Systems  Constitutional: Positive for appetite change.  Gastrointestinal: Positive for abdominal pain, diarrhea, nausea and vomiting.  All other systems reviewed and are negative.   Physical Exam Updated Vital Signs BP 103/73   Pulse (!) 52   Temp 98.6 F (37 C) (Oral)   Resp 18   LMP 07/27/2020   SpO2 100%   Physical Exam Vitals and nursing note reviewed.  Constitutional:      Appearance: She is well-developed.     Comments: Non toxic in NAD  HENT:     Head: Normocephalic and atraumatic.     Nose: Nose normal.  Eyes:     Conjunctiva/sclera: Conjunctivae normal.  Cardiovascular:     Rate and Rhythm: Normal rate and regular  rhythm.  Pulmonary:     Effort: Pulmonary effort is normal.     Breath sounds: Normal breath sounds.  Abdominal:     General: Bowel sounds are normal.     Palpations: Abdomen is soft.     Tenderness: There is generalized abdominal tenderness.     Comments: No G/R/R. No suprapubic or CVA tenderness. Negative Murphy's and McBurney's. Active BS to lower quadrants.   Musculoskeletal:        General: Normal range of motion.     Cervical back: Normal range of motion.  Skin:    General: Skin is warm and dry.     Capillary Refill: Capillary refill takes less than 2 seconds.  Neurological:     Mental Status: She is alert.  Psychiatric:        Behavior: Behavior normal.     ED Results / Procedures / Treatments   Labs (all labs ordered are listed, but only abnormal results are displayed) Labs Reviewed  COMPREHENSIVE METABOLIC PANEL - Abnormal; Notable for the following components:      Result Value   Calcium 8.4 (*)    Total Protein 6.4 (*)    Alkaline Phosphatase 33 (*)    All other components within normal limits  URINALYSIS, ROUTINE W REFLEX MICROSCOPIC - Abnormal; Notable for the following components:   Leukocytes,Ua TRACE (*)    Bacteria, UA RARE (*)    All other components within normal limits  CBC WITH DIFFERENTIAL/PLATELET - Abnormal; Notable for the following components:   WBC 3.8 (*)    All other components within normal limits  SARS CORONAVIRUS 2 BY RT PCR (HOSPITAL ORDER, PERFORMED IN [facility])  URINE CULTURE  LIPASE, BLOOD  I-STAT BETA HCG BLOOD, ED ([facility], [facility], [facility] ONLY)    EKG None  Radiology US Abdomen Limited RUQ  Result Date: 08/12/2020 CLINICAL DATA:  Abdominal pain.  Nausea for a week. EXAM: ULTRASOUND ABDOMEN LIMITED RIGHT UPPER QUADRANT COMPARISON:  None. FINDINGS: Gallbladder: No gallstones or wall thickening visualized. No sonographic Murphy sign noted by sonographer. Common bile duct: Diameter: 2.2 mm Liver: No focal lesion identified.  Within normal limits in parenchymal echogenicity. Portal vein is patent on color Doppler imaging with normal direction of blood flow towards the liver. Other: None. IMPRESSION: Normal study.  No cause for symptoms identified. Electronically Signed   By: Gerome Sam III M.D   On: 08/12/2020 20:46    Procedures Procedures (including critical care time)  Medications Ordered in ED Medications  morphine 4 MG/ML injection 4 mg (4 mg Intravenous Given 08/13/20 0947)  ondansetron (ZOFRAN) injection 4 mg (4 mg Intravenous Given 08/13/20 0948)  sodium chloride 0.9 % bolus 1,000 mL (0 mLs Intravenous Stopped 08/13/20 1121)    ED Course  I have reviewed the triage vital signs and the nursing notes.  Pertinent labs & imaging results that were available during my care of the patient were reviewed by me and considered in my medical decision making (see chart for details).  Clinical Course as of Aug 14 1207  Sat Aug 13, 2020  6301 Leukocytes,Ua(!): TRACE [CG]  0916 WBC, UA: 6-10 [CG]  0916 Bacteria, UA(!): RARE [CG]  0916 Squamous Epithelial / LPF: 0-5 [CG]  0928 SARS Coronavirus 2: NEGATIVE [CG]    Clinical Course User Index [CG] Liberty Handy, PA-C   MDM Rules/Calculators/A&P                          29 year old female presents for nausea, vomiting decreased appetite, generalized abdominal pain, diarrhea.  In the last month has had UTI x2.  Has finished Keflex.  Urinary symptoms have improved.  On vaccinated for COVID.  No abdominal surgeries.  I obtained additional history from triage, nursing notes and review of medical chart.  Previous medical records available, nursing notes reviewed to obtain more history and assist with MDM  Seen twice at urgent care and ER for UTI in the last month.  Both urine culture sent grew E. coli, sensitive to Keflex which she has finished.  Chief complain involves an extensive number of treatment options and is a complaint that carries with it a high risk  of complications and morbidity and mortality.     Initial ER work up including laboratory studies and imaging ordered in triage by RN.    I have personally visualized and interpreted ER diagnostic work up including labs and imaging.    ER work up is reassuring.  WBC 3.8.  Electrolytes normal.  LFTs, lipase, creatinine normal.  Urinalysis today is improved from previous.  hCG is negative.  Covid test negative.  Right upper ultrasound does not show any gallbladder stones, acute cholecystitis.  On my initial exam patient had generalized mostly epigastric tenderness.  I ordered IV fluids, Zofran and morphine.  Patient was reevaluated and had improvement in symptoms.  No longer having vomiting or nausea.  No focal right upper quadrant or right lower quadrant tenderness.  Overall suspect symptoms may be viral in nature.  Also could be related to recent antibiotic use causing stomach upset, diarrhea.  We will discharge with Phenergan, probiotics, Tylenol, gentle diet.  Return precautions discussed with patient.  She has no urinary symptoms at this time and doubt GU process.  Doubt cholecystitis, pancreatitis, appendicitis.  Patient is comfortable with plan of care. Final Clinical Impression(s) / ED Diagnoses Final diagnoses:  Abdominal pain  Nausea vomiting and diarrhea    Rx / DC Orders ED Discharge Orders         Ordered    promethazine (PHENERGAN) 25 MG tablet  Every 6 hours PRN        08/13/20 1121    omeprazole (PRILOSEC) 40 MG capsule  Daily        08/13/20 1121           Liberty Handy, [place] 08/13/20 1208    Virgina Norfolk, DO 08/13/20 1456

## 2020-08-14 LAB — URINE CULTURE: Culture: <10000 — AB

## 2021-03-02 IMAGING — US US MFM OB DETAIL +14 WK
1 series · 13 of 28 positions shown · non-contrast
Comparison: none

[Series 1: us mfm ob detail +14 wk · 13 of 120 slices shown]
[im 5/120]
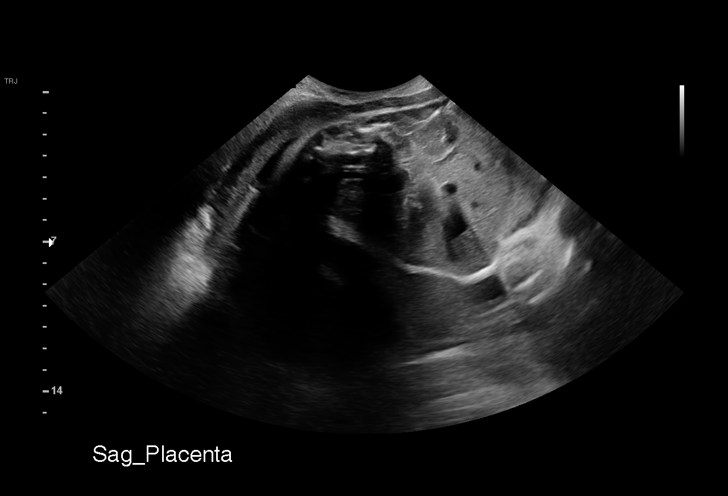
[im 14/120]
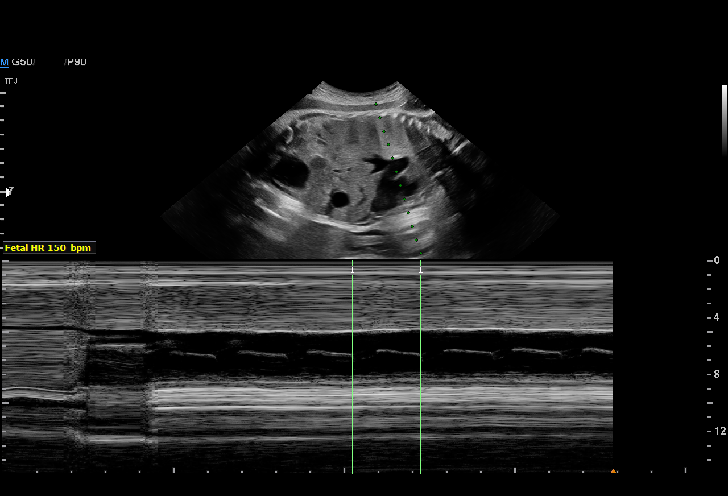
[im 23/120]
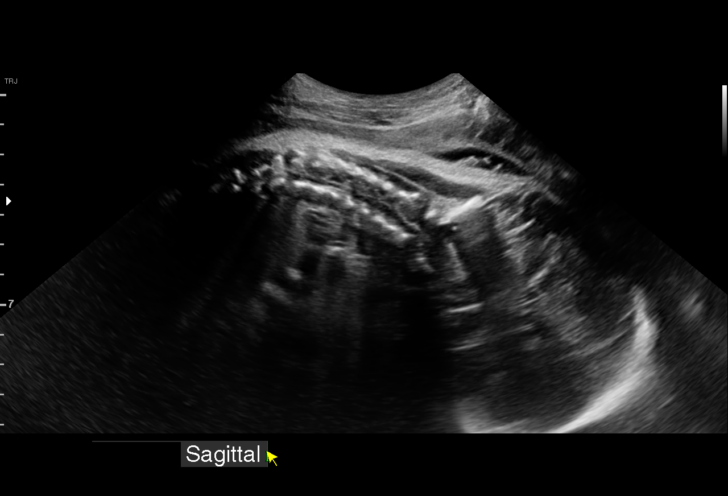
[im 31/120]
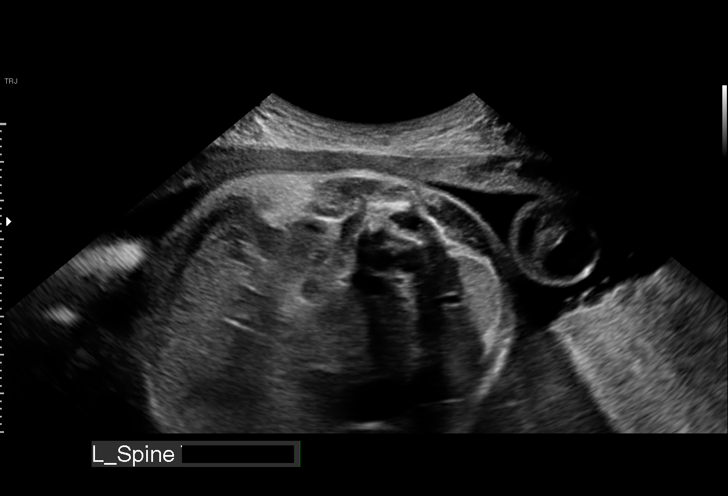
[im 40/120]
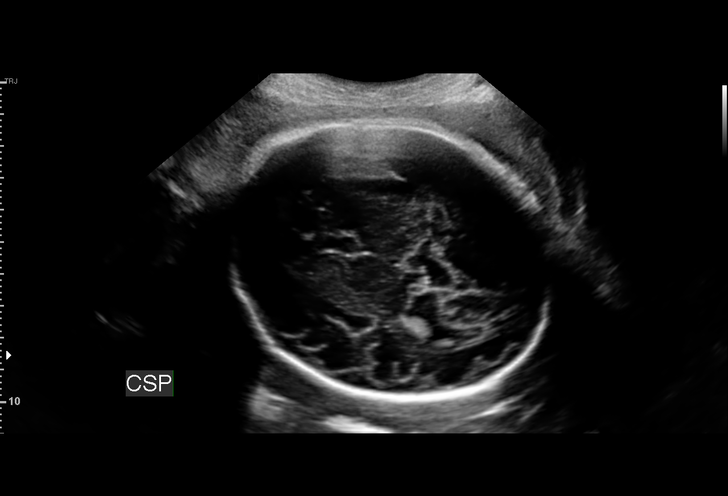
[im 49/120]
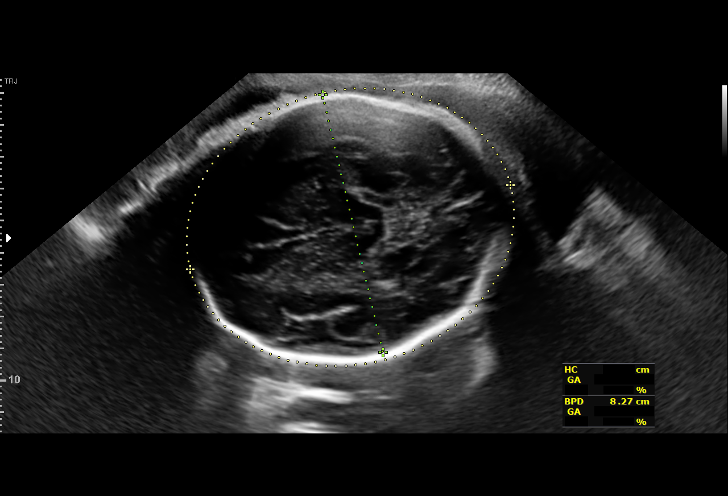
[im 62/120]
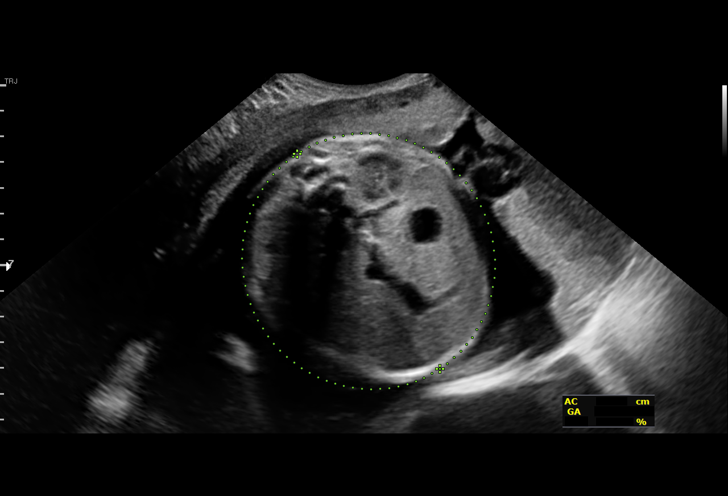
[im 71/120]
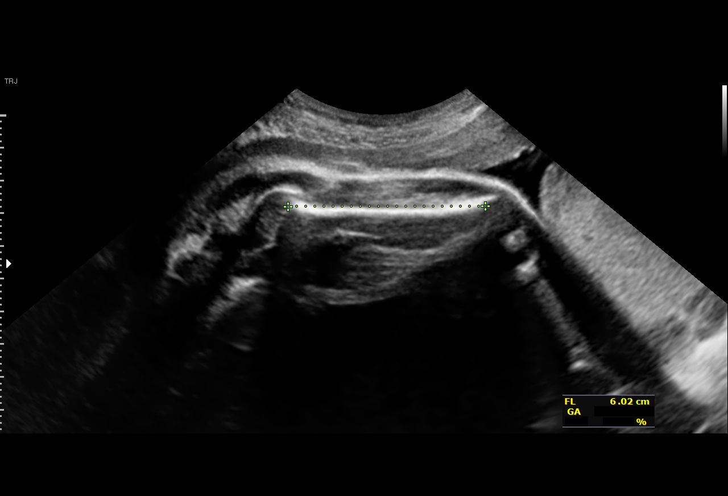
[im 80/120]
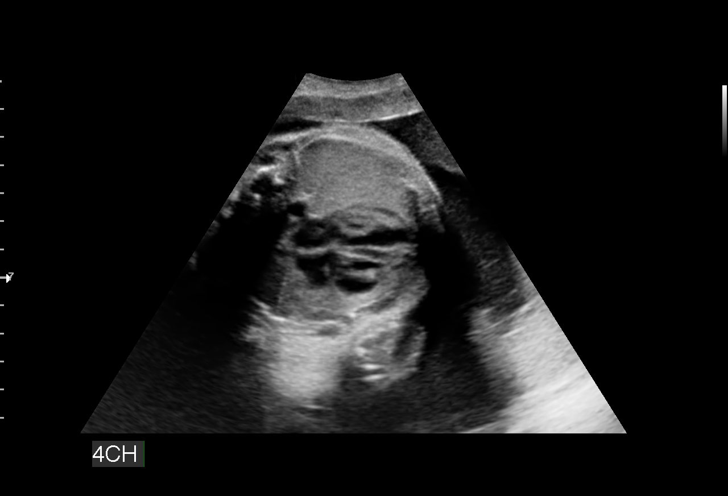
[im 89/120]
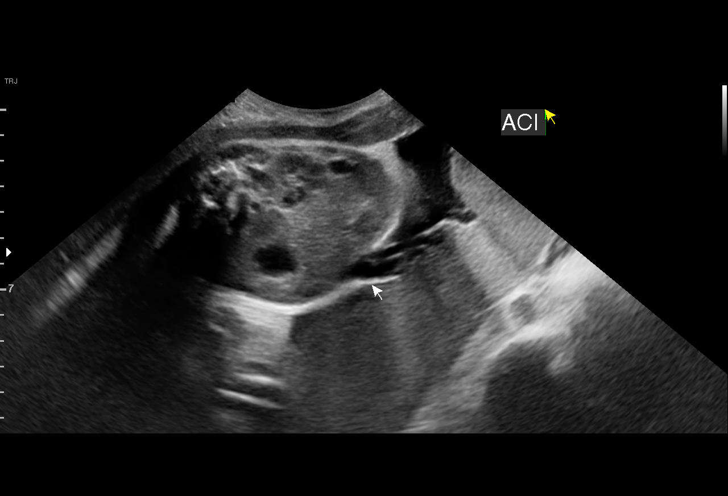
[im 97/120]
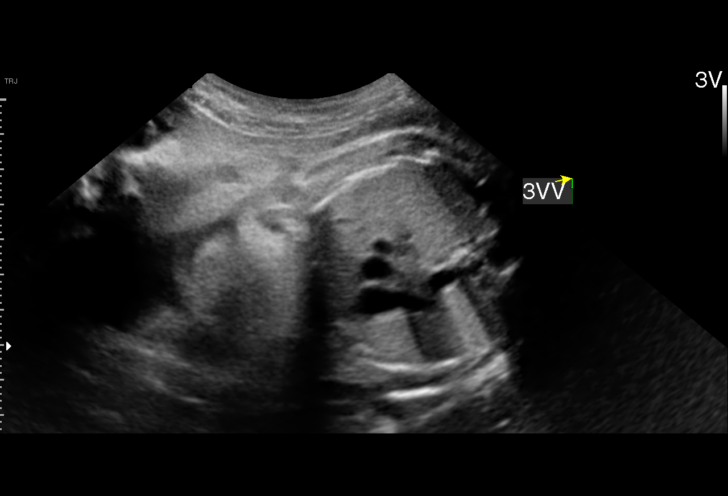
[im 106/120]
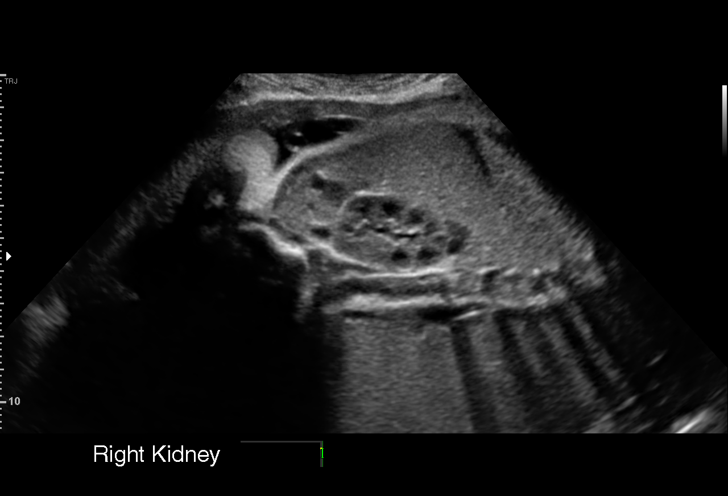
[im 115/120]
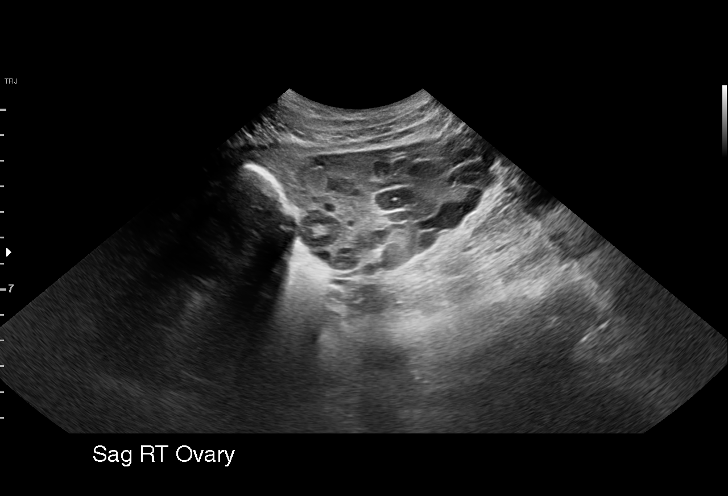

[13 of 28 positions shown; findings below may reference images not displayed]

----------------------------------------------------------------------

 ----------------------------------------------------------------------
Indications

  Encounter for antenatal screening for
  malformations
  Late to prenatal care, third trimester
  (Initiated pnc at 31 weeks)
  31 weeks gestation of pregnancy
 ----------------------------------------------------------------------
Fetal Evaluation

 Num Of Fetuses:          1
 Fetal Heart Rate(bpm):   150
 Cardiac Activity:        Observed
 Presentation:            Cephalic
 Placenta:                Posterior
 P. Cord Insertion:       Visualized

 Amniotic Fluid
 AFI FV:      Within normal limits

 AFI Sum(cm)     %Tile       Largest Pocket(cm)
 16.86           62

 RUQ(cm)       RLQ(cm)       LUQ(cm)        LLQ(cm)

Biometry

 BPD:      83.4  mm     G. Age:  33w 4d         92  %    CI:        74.24   %    70 - 86
                                                         FL/HC:       19.3  %    19.3 -
 HC:      307.3  mm     G. Age:  34w 2d         86  %    HC/AC:       1.00       0.96 -
 AC:      308.2  mm     G. Age:  34w 5d       > 99  %    FL/BPD:      71.2  %    71 - 87
 FL:       59.4  mm     G. Age:  31w 0d         24  %    FL/AC:       19.3  %    20 - 24
 HUM:      55.7  mm     G. Age:  32w 3d         71  %
 CER:      42.8  mm     G. Age:  36w 6d       > 95  %
 LV:        7.1  mm
 CM:        5.6  mm

 Est. FW:    3333   gm   4 lb 14 oz      95  %
OB History

 Gravidity:    4         Term:   2
 TOP:          1        Living:  2
Gestational Age

 LMP:           31w 3d        Date:  10/18/18                 EDD:   07/25/19
 U/S Today:     33w 3d                                        EDD:   07/11/19
 Best:          31w 3d     Det. By:  LMP  (10/18/18)          EDD:   07/25/19
Anatomy

 Cranium:               Appears normal         Aortic Arch:            Appears normal
 Cavum:                 Appears normal         Ductal Arch:            Appears normal
 Ventricles:            Appears normal         Diaphragm:              Appears normal
 Choroid Plexus:        Appears normal         Stomach:                Appears normal, left
                                                                       sided
 Cerebellum:            Appears normal         Abdomen:                Appears normal
 Posterior Fossa:       Appears normal         Abdominal Wall:         Appears nml (cord
                                                                       insert, abd wall)
 Nuchal Fold:           Not applicable (>20    Cord Vessels:           Appears normal (3
                        wks GA)                                        vessel cord)
 Face:                  Profile appears        Kidneys:                Appear normal
                        normal
 Lips:                  Appears normal         Bladder:                Appears normal
 Heart:                 Appears normal         Spine:                  Appears normal
                        (4CH, axis, and
                        situs)
 RVOT:                  Appears normal         Upper Extremities:      Appears normal
 LVOT:                  Appears normal         Lower Extremities:      Appears normal

 Other:  Fetus appears to be a male. Heels visualized. 3VV and 3VTV
         visualized. Nasal bone visualized.
Cervix Uterus Adnexa

 Left Ovary
 Within normal limits.

 Right Ovary
 Within normal limits.
Impression

 Late prenatal care.

 The estimated fetal weight is at the 95th percentile and the
 abdominal circumference measurement is at greater than the
 99th percentile. Amniotic fluid is normal and good fetal
 activity is seen. Fetal anatomy appears normal, but limited by
 advanced gestational age.

 Today's ultrasound biometry is 2 weeks ahead of her
 gestational age established by LMP (sure date). We have not
 amended her EDD.
 Patient had screening for gestational diabetes today.
Recommendations

 -An appointment was made for her to return in 4 weeks for
 fetal growth assessment.
                 Tiger, Laaouina

## 2021-05-30 ENCOUNTER — Encounter: Payer: Self-pay | Admitting: Neurology

## 2021-06-21 ENCOUNTER — Other Ambulatory Visit: Payer: Self-pay

## 2021-06-21 MED ORDER — NORGESTIMATE-ETH ESTRADIOL 0.25-35 MG-MCG PO TABS: 1.0000 | ORAL_TABLET | Freq: Every day | ORAL | 1 refills | Status: DC
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
## 2021-08-21 ENCOUNTER — Ambulatory Visit: Payer: Medicaid Other | Admitting: Neurology

## 2021-08-21 ENCOUNTER — Encounter: Payer: Self-pay | Admitting: Neurology

## 2021-08-21 ENCOUNTER — Other Ambulatory Visit: Payer: Self-pay

## 2021-08-21 VITALS — BP 120/81 | HR 87 | Ht 69.0 in | Wt 125.2 lb

## 2021-08-21 DIAGNOSIS — Z8249 Family history of ischemic heart disease and other diseases of the circulatory system: Secondary | ICD-10-CM | POA: Diagnosis not present

## 2021-08-21 DIAGNOSIS — G43709 Chronic migraine without aura, not intractable, without status migrainosus: Secondary | ICD-10-CM

## 2021-08-21 DIAGNOSIS — G930 Cerebral cysts: Secondary | ICD-10-CM | POA: Diagnosis not present

## 2021-08-21 DIAGNOSIS — Z0279 Encounter for issue of other medical certificate: Secondary | ICD-10-CM

## 2021-08-21 MED ORDER — NORTRIPTYLINE HCL 25 MG PO CAPS
25.0000 mg | ORAL_CAPSULE | Freq: Every day | ORAL | 5 refills | Status: DC
Start: 2021-08-21 — End: 2022-03-08

## 2021-08-21 NOTE — Patient Instructions (Signed)
  Will check CTA of head Start nortriptyline 25mg  at bedtime.  Contact in 4 weeks with update and we can increase dose if needed. Limit use of pain relievers to no more than 2 days out of the week.  These medications include acetaminophen, NSAIDs (ibuprofen/Advil/Motrin, naproxen/Aleve, triptans (Imitrex/sumatriptan), Excedrin, and narcotics.  This will help reduce risk of rebound headaches. Be aware of common food triggers:  - Caffeine:  coffee, black tea, cola, Mt. Dew  - Chocolate  - Dairy:  aged cheeses (brie, blue, cheddar, gouda, [place], provolone, Macy, Swiss, etc), chocolate milk, buttermilk, sour cream, limit eggs and yogurt  - Nuts, peanut butter  - Alcohol  - Cereals/grains:  FRESH breads (fresh bagels, sourdough, doughnuts), yeast productions  - Processed/canned/aged/cured meats (pre-packaged deli meats, hotdogs)  - MSG/glutamate:  soy sauce, flavor enhancer, pickled/preserved/marinated foods  - Sweeteners:  aspartame (Equal, Nutrasweet).  Sugar and Splenda are okay  - Vegetables:  legumes (lima beans, lentils, snow peas, fava beans, pinto peans, peas, garbanzo beans), sauerkraut, onions, olives, pickles  - Fruit:  avocados, bananas, citrus fruit (orange, lemon, grapefruit), mango  - Other:  Frozen meals, macaroni and cheese Routine exercise Stay adequately hydrated (aim for 64 oz water daily) Keep headache diary Maintain proper stress management Maintain proper sleep hygiene Do not skip meals Consider supplements:  magnesium citrate 400mg  daily, riboflavin 400mg  daily, coenzyme Q10 100mg  three times daily.

## 2021-08-28 NOTE — Progress Notes (Signed)
FMLA paperwork filled out and faxed.  LMOVM for pt, Patient at front desk. Chart copy sent to scan.

## 2021-08-31 ENCOUNTER — Telehealth (INDEPENDENT_AMBULATORY_CARE_PROVIDER_SITE_OTHER): Payer: Medicaid Other | Admitting: Student

## 2021-08-31 DIAGNOSIS — Z3041 Encounter for surveillance of contraceptive pills: Secondary | ICD-10-CM

## 2021-08-31 MED ORDER — NORGESTIMATE-ETH ESTRADIOL 0.25-35 MG-MCG PO TABS: 1.0000 | ORAL_TABLET | Freq: Every day | ORAL | 11 refills | Status: DC

## 2021-08-31 NOTE — Progress Notes (Signed)
Patient needs a refill on birth control pills

## 2021-08-31 NOTE — Progress Notes (Signed)
I connected with  Verita Schneiders on 08/31/21 at  2:55 PM EDT by MyChart Virtual Video Visit and verified that I am speaking with the correct person using two identifiers.   I discussed the limitations, risks, security and privacy concerns of performing an evaluation and management service by telephone and the availability of in person appointments. I also discussed with the patient that there may be a patient responsible charge related to this service. The patient expressed understanding and agreed to proceed.  Guy Begin, CMA 08/31/2021  3:13 PM

## 2021-08-31 NOTE — Progress Notes (Signed)
   TELEHEALTH GYNECOLOGY VISIT ENCOUNTER NOTE  Provider location: [facility]   Patient location: Home  I connected with Kristina Dunn on 08/31/21 at  2:55 PM EDT by telephone and verified that I am speaking with the correct person using two identifiers. Patient was unable to do MyChart audiovisual encounter due to technical difficulties, she tried several times.    I discussed the limitations, risks, security and privacy concerns of performing an evaluation and management service by telephone and the availability of in person appointments. I also discussed with the patient that there may be a patient responsible charge related to this service. The patient expressed understanding and agreed to proceed.   History:  Kristina Dunn is a 30 y.o. [id number] female being evaluated today for refill for birth control. She denies any abnormal vaginal discharge, bleeding, pelvic pain or other concerns.       Past Medical History:  Diagnosis Date   Medical history non-contributory    Past Surgical History:  Procedure Laterality Date   WISDOM TOOTH EXTRACTION     The following portions of the patient's history were reviewed and updated as appropriate: allergies, current medications, past family history, past medical history, past social history, past surgical history and problem list.   Health Maintenance:  Normal pap and negative HRHPV on 05/26/2019.  NA   Review of Systems:  Pertinent items noted in HPI and remainder of comprehensive ROS otherwise negative.  Physical Exam:   General:  Alert, oriented and cooperative.   Mental Status: Normal mood and affect perceived. Normal judgment and thought content.  Physical exam deferred due to nature of the encounter  Labs and Imaging No results found for this or any previous visit (from the past 336 hour(s)). No results found.    Assessment and Plan:        1. Encounter for surveillance of contraceptive  pills    -Patient has family history of aneurysm BUT NOT BLOOD CLOTS. Patient is very clear that her father did not have a blood clot. She denies migraine with aura. She does not smoke cigarettes or have high blood pressure. She is very insistent on birth control; this is a method that works for her and that she has been taking for years. Since this does not appear to be a blood clotting issue, she is safe to take OCP.  -patient has follow up with neurology for her family history of aneuerysm; patient also states that her neurologist told her she can take OCP.  -refill of birth control given -patient will scheduled for pap in 1 year  I discussed the assessment and treatment plan with the patient. The patient was provided an opportunity to ask questions and all were answered. The patient agreed with the plan and demonstrated an understanding of the instructions.   The patient was advised to call back or seek an in-person evaluation/go to the ED if the symptoms worsen or if the condition fails to improve as anticipated.  I provided 20 minutes of non-face-to-face time during this encounter.   Marylene Land, CNM [facility]

## 2021-09-13 ENCOUNTER — Other Ambulatory Visit: Payer: Medicaid Other

## 2021-10-02 ENCOUNTER — Other Ambulatory Visit: Payer: Self-pay

## 2021-10-02 ENCOUNTER — Ambulatory Visit
Admission: RE | Admit: 2021-10-02 | Discharge: 2021-10-02 | Disposition: A | Discharge status: Home / Self Care | Payer: Medicaid Other | Source: Ambulatory Visit | Attending: Neurology | Admitting: Neurology

## 2021-10-02 DIAGNOSIS — Z8249 Family history of ischemic heart disease and other diseases of the circulatory system: Secondary | ICD-10-CM

## 2021-10-02 DIAGNOSIS — G930 Cerebral cysts: Secondary | ICD-10-CM

## 2021-10-02 IMAGING — CT CT ANGIO HEAD
1 of 4 series · 4 of 30 positions shown · IV contrast (APPLIED)
Comparison: Brain MRI 05/17/2021

CLINICAL DATA: Family history of cerebral aneurysm with daily
headache.

EXAM:
CT ANGIOGRAPHY HEAD
TECHNIQUE: Multidetector CT imaging of the head was performed using the
standard protocol during bolus administration of intravenous
contrast. Multiplanar CT image reconstructions and MIPs were
obtained to evaluate the vascular anatomy.
CONTRAST:  75mL EOCWQP-22K IOPAMIDOL (EOCWQP-22K) INJECTION 76%

[Series 7: head angio · axial · 0.44mm/px · z∈[-223,-118]mm · 4 of 59 slices shown]
[im 12/59  brain]
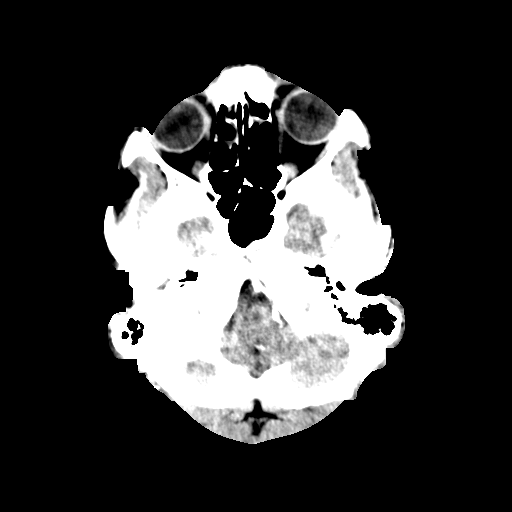
[im 24/59  bone]
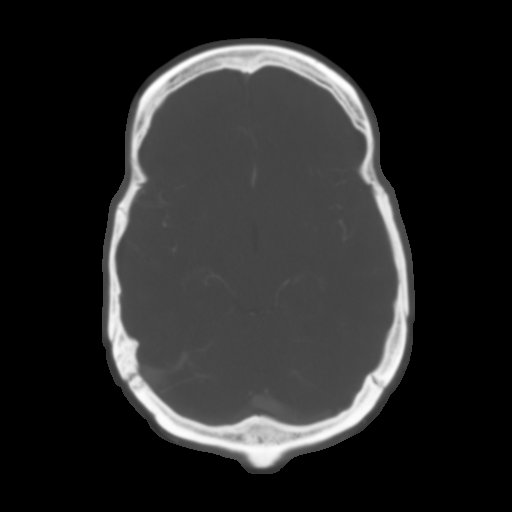
[im 35/59  brain]
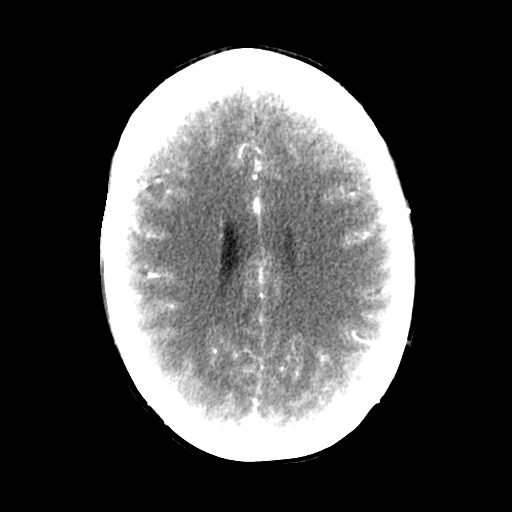
[im 47/59  bone]
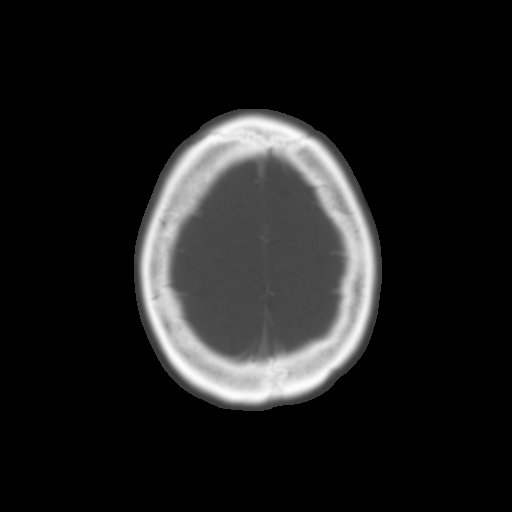

[4 of 30 positions shown; findings below may reference images not displayed]

FINDINGS: CT HEAD

Brain: No evidence of infarction, hemorrhage, hydrocephalus,
extra-axial collection or mass lesion/mass effect.

Vascular: See below

Skull: Normal. Negative for fracture or focal lesion.

Sinuses: No acute or significant finding.

CTA HEAD

Anterior circulation: No significant stenosis, proximal occlusion,
aneurysm, or vascular malformation.

Posterior circulation: No significant stenosis, proximal occlusion,
aneurysm, or vascular malformation.

Venous sinuses: Diffusely patent

Anatomic variants: Negative

Review of the MIP images confirms the above findings.
IMPRESSION: Normal CTA of the head.

## 2021-10-02 MED ORDER — IOPAMIDOL (ISOVUE-370) INJECTION 76%
75.0000 mL | Freq: Once | INTRAVENOUS | Status: AC | PRN
  Administered 2021-10-02: 75 mL via INTRAVENOUS

## 2021-10-10 ENCOUNTER — Telehealth: Payer: Self-pay

## 2021-10-10 MED ORDER — RIZATRIPTAN BENZOATE 10 MG PO TBDP: 10.0000 mg | ORAL_TABLET | ORAL | 11 refills | Status: DC | PRN

## 2021-10-10 NOTE — Telephone Encounter (Signed)
Pt is returning a call to someone 

## 2021-10-10 NOTE — Telephone Encounter (Signed)
LMOVM please call the office back.

## 2021-10-10 NOTE — Telephone Encounter (Signed)
-----   Message from Drema Dallas, DO sent at 10/05/2021  7:22 AM EST ----- CT reveals no aneurysm.  I would like to prescribe her a headache rescue medication called rizatriptan.  Please send script for rizatriptan 10mg :  Take 1 tablet at earliest onset of headache.  May repeat in 2 hours. Maximum 2 tablets in 24 hours.  Quantity:  Default number, Refills: 5.

## 2021-10-12 NOTE — Telephone Encounter (Signed)
Pt advised.
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
## 2022-02-23 ENCOUNTER — Ambulatory Visit: Payer: Medicaid Other | Admitting: Neurology

## 2022-02-23 ENCOUNTER — Encounter: Payer: Self-pay | Admitting: Neurology

## 2022-02-23 VITALS — BP 117/81 | HR 80 | Ht 69.0 in | Wt 132.0 lb

## 2022-02-23 DIAGNOSIS — G43009 Migraine without aura, not intractable, without status migrainosus: Secondary | ICD-10-CM | POA: Diagnosis not present

## 2022-02-23 MED ORDER — SUMATRIPTAN SUCCINATE 100 MG PO TABS: 100.0000 mg | ORAL_TABLET | ORAL | 5 refills | Status: DC | PRN

## 2022-02-23 NOTE — Patient Instructions (Signed)
Continue nortriptyline 25mg  at bedtime ?Take sumatriptan 100mg  at earliest onset of headache.  May repeat in 2 hours.  Maximum 2 tablets in 24 hours ?Limit use of pain relievers to no more than 2 days out of week to prevent risk of rebound or medication-overuse headache. ?Keep headache diary ?Follow up 6 months. ?

## 2022-03-06 ENCOUNTER — Other Ambulatory Visit: Payer: Self-pay | Admitting: Neurology

## 2022-06-11 ENCOUNTER — Observation Stay (HOSPITAL_COMMUNITY)
Admission: EM | Admit: 2022-06-11 | Discharge: 2022-06-12 | Disposition: A | Discharge status: Home / Self Care | Payer: Medicaid Other | Attending: Surgery | Admitting: Surgery

## 2022-06-11 ENCOUNTER — Other Ambulatory Visit: Payer: Self-pay

## 2022-06-11 ENCOUNTER — Emergency Department (HOSPITAL_COMMUNITY): Payer: Medicaid Other

## 2022-06-11 ENCOUNTER — Encounter (HOSPITAL_COMMUNITY): Payer: Self-pay | Admitting: Emergency Medicine

## 2022-06-11 DIAGNOSIS — R1031 Right lower quadrant pain: Principal | ICD-10-CM | POA: Insufficient documentation

## 2022-06-11 DIAGNOSIS — F17210 Nicotine dependence, cigarettes, uncomplicated: Secondary | ICD-10-CM | POA: Insufficient documentation

## 2022-06-11 DIAGNOSIS — R109 Unspecified abdominal pain: Secondary | ICD-10-CM | POA: Diagnosis present

## 2022-06-11 LAB — CBC WITH DIFFERENTIAL/PLATELET
Abs Immature Granulocytes: 0.03 10*3/uL (ref 0.00–0.07)
Basophils Absolute: 0.1 10*3/uL (ref 0.0–0.1)
Basophils Relative: 1 %
Eosinophils Absolute: 0.2 10*3/uL (ref 0.0–0.5)
Eosinophils Relative: 2 %
HCT: 41.6 % (ref 36.0–46.0)
Hemoglobin: 14 g/dL (ref 12.0–15.0)
Immature Granulocytes: 0 %
Lymphocytes Relative: 18 %
Lymphs Abs: 1.5 10*3/uL (ref 0.7–4.0)
MCH: 31.2 pg (ref 26.0–34.0)
MCHC: 33.7 g/dL (ref 30.0–36.0)
MCV: 92.7 fL (ref 80.0–100.0)
Monocytes Absolute: 0.4 10*3/uL (ref 0.1–1.0)
Monocytes Relative: 5 %
Neutro Abs: 6.2 10*3/uL (ref 1.7–7.7)
Neutrophils Relative %: 74 %
Platelets: 303 10*3/uL (ref 150–400)
RBC: 4.49 MIL/uL (ref 3.87–5.11)
RDW: 12.1 % (ref 11.5–15.5)
WBC: 8.4 10*3/uL (ref 4.0–10.5)
nRBC: 0 % (ref 0.0–0.2)

## 2022-06-11 LAB — I-STAT BETA HCG BLOOD, ED (MC, WL, AP ONLY): I-stat hCG, quantitative: <5 m[IU]/mL (ref <5)

## 2022-06-11 LAB — COMPREHENSIVE METABOLIC PANEL
ALT: 12 U/L (ref 0–44)
AST: 19 U/L (ref 15–41)
Albumin: 4.1 g/dL (ref 3.5–5.0)
Alkaline Phosphatase: 44 U/L (ref 38–126)
Anion gap: 9 (ref 5–15)
BUN: 8 mg/dL (ref 6–20)
CO2: 25 mmol/L (ref 22–32)
Calcium: 9.4 mg/dL (ref 8.9–10.3)
Chloride: 105 mmol/L (ref 98–111)
Creatinine, Ser: 1 mg/dL (ref 0.44–1.00)
GFR, Estimated: >60 mL/min (ref >60)
Glucose, Bld: 84 mg/dL (ref 70–99)
Potassium: 4.3 mmol/L (ref 3.5–5.1)
Sodium: 139 mmol/L (ref 135–145)
Total Bilirubin: 0.4 mg/dL (ref 0.3–1.2)
Total Protein: 7.5 g/dL (ref 6.5–8.1)

## 2022-06-11 LAB — URINALYSIS, ROUTINE W REFLEX MICROSCOPIC
Bilirubin Urine: NEGATIVE
Glucose, UA: NEGATIVE mg/dL
Hgb urine dipstick: NEGATIVE
Ketones, ur: NEGATIVE mg/dL
Leukocytes,Ua: NEGATIVE
Nitrite: NEGATIVE
Protein, ur: NEGATIVE mg/dL
Specific Gravity, Urine: 1.008 (ref 1.005–1.030)
pH: 6 (ref 5.0–8.0)

## 2022-06-11 LAB — LIPASE, BLOOD: Lipase: 39 U/L (ref 11–51)

## 2022-06-11 MED ORDER — METHOCARBAMOL 1000 MG/10ML IJ SOLN
750.0000 mg | Freq: Four times a day (QID) | INTRAVENOUS | Status: DC
  Filled 2022-06-11: qty 7.5

## 2022-06-11 MED ORDER — LACTATED RINGERS IV SOLN: INTRAVENOUS | Status: DC

## 2022-06-11 MED ORDER — ENOXAPARIN SODIUM 40 MG/0.4ML IJ SOSY
40.0000 mg | PREFILLED_SYRINGE | INTRAMUSCULAR | Status: DC
  Administered 2022-06-12: 40 mg via SUBCUTANEOUS
  Filled 2022-06-11: qty 0.4

## 2022-06-11 MED ORDER — SODIUM CHLORIDE 0.9 % IV BOLUS
1000.0000 mL | Freq: Once | INTRAVENOUS | Status: AC
  Administered 2022-06-11: 1000 mL via INTRAVENOUS

## 2022-06-11 MED ORDER — IOHEXOL 300 MG/ML  SOLN
100.0000 mL | Freq: Once | INTRAMUSCULAR | Status: AC | PRN
  Administered 2022-06-11: 100 mL via INTRAVENOUS

## 2022-06-11 MED ORDER — MORPHINE SULFATE (PF) 4 MG/ML IV SOLN
4.0000 mg | Freq: Once | INTRAVENOUS | Status: AC
Start: 2022-06-11 — End: 2022-06-11
  Administered 2022-06-11: 4 mg via INTRAVENOUS
  Filled 2022-06-11: qty 1

## 2022-06-11 MED ORDER — ONDANSETRON HCL 4 MG/2ML IJ SOLN: 4.0000 mg | Freq: Four times a day (QID) | INTRAMUSCULAR | Status: DC | PRN

## 2022-06-11 MED ORDER — DOCUSATE SODIUM 100 MG PO CAPS
100.0000 mg | ORAL_CAPSULE | Freq: Two times a day (BID) | ORAL | Status: DC
  Administered 2022-06-11 – 2022-06-12 (×2): 100 mg via ORAL
  Filled 2022-06-11 (×2): qty 1

## 2022-06-11 MED ORDER — ACETAMINOPHEN 500 MG PO TABS
1000.0000 mg | ORAL_TABLET | Freq: Four times a day (QID) | ORAL | Status: DC
  Administered 2022-06-12 (×2): 1000 mg via ORAL
  Filled 2022-06-11 (×2): qty 2

## 2022-06-11 MED ORDER — ONDANSETRON HCL 4 MG/2ML IJ SOLN
4.0000 mg | Freq: Once | INTRAMUSCULAR | Status: AC
  Administered 2022-06-11: 4 mg via INTRAVENOUS
  Filled 2022-06-11: qty 2

## 2022-06-11 MED ORDER — ONDANSETRON 4 MG PO TBDP: 4.0000 mg | ORAL_TABLET | Freq: Four times a day (QID) | ORAL | Status: DC | PRN

## 2022-06-11 MED ORDER — METHOCARBAMOL 750 MG PO TABS
750.0000 mg | ORAL_TABLET | Freq: Four times a day (QID) | ORAL | Status: DC
  Administered 2022-06-11 – 2022-06-12 (×2): 750 mg via ORAL
  Filled 2022-06-11: qty 2
  Filled 2022-06-11: qty 1

## 2022-06-11 MED ORDER — KETOROLAC TROMETHAMINE 15 MG/ML IJ SOLN
30.0000 mg | Freq: Four times a day (QID) | INTRAMUSCULAR | Status: DC | PRN
  Administered 2022-06-11: 30 mg via INTRAVENOUS
  Filled 2022-06-11: qty 2

## 2022-06-11 NOTE — ED Triage Notes (Signed)
Pt states she has been having right lower abd pain that has worsened over the past week. Having nausea. Pt went to urgent care but was sent here for further evaluation. Pt was given zofran for nausea at UC.

## 2022-06-11 NOTE — ED Provider Triage Note (Signed)
Emergency Medicine Provider Triage Evaluation Note  Kristina Dunn , a 31 y.o. female  was evaluated in triage.  Pt complains of abdominal pain.  1 week ago patient started having and suprapubic pain.  She says it is gradually worsened and has been getting more and more to the right lower quadrant.  She has had associated cold and hot flashes, nausea, feeling like she is going to pass out.  She went to an urgent care prior to this who sent her to the emergency department for further work-up. She denies any urinary symptoms, vaginal discharge, vaginal bleeding, constipation, diarrhea, vomiting.  Review of Systems  Positive: Abdominal pain, nausea, chills Negative:   Physical Exam  There were no vitals taken for this visit. Gen:   Awake, no distress   Resp:  Normal effort  MSK:   Moves extremities without difficulty  Other:  + Mcburneys, RLQ tenderness localized. No guarding or rigidity  Medical Decision Making  Medically screening exam initiated at 10:16 AM.  Appropriate orders placed.  Kristina Dunn was informed that the remainder of the evaluation will be completed by another provider, this initial triage assessment does not replace that evaluation, and the importance of remaining in the ED until their evaluation is complete.     Claudie Leach, PA-C 06/11/22 1018

## 2022-06-11 NOTE — H&P (Signed)
Reason for Consult/Chief Complaint: RLQ abdominal pain  Consultant: Tegeler, MD  Kristina Dunn is an 31 y.o. female.   HPI: 9F with abdominal pain since the middle of last week. Pain localized initially to right infraumbilical region and intermittent, now in the RLQ, worse in intensity, and constant. Associated nausea without vomiting, "hot flashes". Last BM 7/15 AM and BMs have been "normal to me". Her normal is once every few days. Describes stools as soft and brown. No urinary symptoms. No prior abdominal surgery. LMP 05/30/22. No prior colonoscopy. No sick contacts. No viral/URI symptoms. No personal history or IBD/IBS, Crohns, UC, or colon cancer. Unsure of family history, but does not believe any family history of the above either.    Past Medical History:  Diagnosis Date   Medical history non-contributory     Past Surgical History:  Procedure Laterality Date   WISDOM TOOTH EXTRACTION      Family History  Problem Relation Age of Onset   Cancer Mother    Migraines Father    Diabetes Father    Cerebral aneurysm Father     Social History:  reports that she has been smoking cigarettes. She has been smoking an average of .5 packs per day. She has never used smokeless tobacco. She reports current alcohol use. She reports that she does not currently use drugs.  Allergies:  Allergies  Allergen Reactions   Acetaminophen Nausea And Vomiting    And causes severe abdominal pain    Medications: I have reviewed the patient's current medications.  Results for orders placed or performed during the hospital encounter of 06/11/22 (from the past 48 hour(s))  Urinalysis, Routine w reflex microscopic Urine, Clean Catch     Status: None   Collection Time: 06/11/22 10:17 AM  Result Value Ref Range   Color, Urine YELLOW YELLOW   APPearance CLEAR CLEAR   Specific Gravity, Urine 1.008 1.005 - 1.030   pH 6.0 5.0 - 8.0   Glucose, UA NEGATIVE NEGATIVE mg/dL   Hgb urine dipstick NEGATIVE  NEGATIVE   Bilirubin Urine NEGATIVE NEGATIVE   Ketones, ur NEGATIVE NEGATIVE mg/dL   Protein, ur NEGATIVE NEGATIVE mg/dL   Nitrite NEGATIVE NEGATIVE   Leukocytes,Ua NEGATIVE NEGATIVE    Comment: Performed at [facility], [address] [place], [place] [address]  CBC with Differential     Status: None   Collection Time: 06/11/22 10:23 AM  Result Value Ref Range   WBC 8.4 4.0 - 10.5 K/uL   RBC 4.49 3.87 - 5.11 MIL/uL   Hemoglobin 14.0 12.0 - 15.0 g/dL   HCT 93.7 90.2 - 40.9 %   MCV 92.7 80.0 - 100.0 fL   MCH 31.2 26.0 - 34.0 pg   MCHC 33.7 30.0 - 36.0 g/dL   RDW 73.5 32.9 - 92.4 %   Platelets 303 150 - 400 K/uL   nRBC 0.0 0.0 - 0.2 %   Neutrophils Relative % 74 %   Neutro Abs 6.2 1.7 - 7.7 K/uL   Lymphocytes Relative 18 %   Lymphs Abs 1.5 0.7 - 4.0 K/uL   Monocytes Relative 5 %   Monocytes Absolute 0.4 0.1 - 1.0 K/uL   Eosinophils Relative 2 %   Eosinophils Absolute 0.2 0.0 - 0.5 K/uL   Basophils Relative 1 %   Basophils Absolute 0.1 0.0 - 0.1 K/uL   Immature Granulocytes 0 %   Abs Immature Granulocytes 0.03 0.00 - 0.07 K/uL    Comment: Performed at [facility]  [facility], [address] [place], [place] [address]  Comprehensive metabolic panel     Status: None   Collection Time: 06/11/22 10:23 AM  Result Value Ref Range   Sodium 139 135 - 145 mmol/L   Potassium 4.3 3.5 - 5.1 mmol/L   Chloride 105 98 - 111 mmol/L   CO2 25 22 - 32 mmol/L   Glucose, Bld 84 70 - 99 mg/dL    Comment: Glucose reference range applies only to samples taken after fasting for at least 8 hours.   BUN 8 6 - 20 mg/dL   Creatinine, Ser 0.10 0.44 - 1.00 mg/dL   Calcium 9.4 8.9 - 93.2 mg/dL   Total Protein 7.5 6.5 - 8.1 g/dL   Albumin 4.1 3.5 - 5.0 g/dL   AST 19 15 - 41 U/L   ALT 12 0 - 44 U/L   Alkaline Phosphatase 44 38 - 126 U/L   Total Bilirubin 0.4 0.3 - 1.2 mg/dL   GFR, Estimated >35 >57 mL/min    Comment: (NOTE) Calculated using the CKD-EPI Creatinine Equation (2021)    Anion gap 9  5 - 15    Comment: Performed at [facility], [address] [place], [place] [address]  Lipase, blood     Status: None   Collection Time: 06/11/22 10:23 AM  Result Value Ref Range   Lipase 39 11 - 51 U/L    Comment: Performed at [facility], [address], [place], [place] [address]  POC beta hCG blood, ED     Status: None   Collection Time: 06/11/22 10:34 AM  Result Value Ref Range   I-stat hCG, quantitative <5.0 <5 mIU/mL   Comment 3            Comment:   GEST. AGE      CONC.  (mIU/mL)   <=1 WEEK        5 - 50     2 WEEKS       50 - 500     3 WEEKS       100 - 10,000     4 WEEKS     1,000 - 30,000        FEMALE AND NON-PREGNANT FEMALE:     LESS THAN 5 mIU/mL     CT Abdomen Pelvis W Contrast  Result Date: 06/11/2022 CLINICAL DATA:  Pain right lower quadrant of abdomen EXAM: CT ABDOMEN AND PELVIS WITH CONTRAST TECHNIQUE: Multidetector CT imaging of the abdomen and pelvis was performed using the standard protocol following bolus administration of intravenous contrast. RADIATION DOSE REDUCTION: This exam was performed according to the departmental dose-optimization program which includes automated exposure control, adjustment of the mA and/or kV according to patient size and/or use of iterative reconstruction technique. CONTRAST:  OMNIPAQUE IOHEXOL 300 MG/ML  SOLN COMPARISON:  06/06/2021 FINDINGS: Lower chest: Unremarkable. Hepatobiliary: Liver is enlarged measuring 20.7 cm in length. There is no dilation of bile ducts. There are few small subcentimeter low-density foci, possibly cysts or hemangiomas. Gallbladder is not distended. Pancreas: There is slight prominence of pancreatic duct. No focal abnormalities are seen. Spleen: Unremarkable. Adrenals/Urinary Tract: Adrenals are unremarkable. There is no hydronephrosis. There are no renal or ureteral stones. Urinary bladder is not distended. Stomach/Bowel: Stomach is not distended. Small bowel loops are unremarkable. The  appendix is not seen. There is no pericecal inflammation. There is no significant wall thickening in colon. There is no pericolic stranding. Vascular/Lymphatic: Unremarkable. Reproductive:  Uterus is unremarkable. There are no dominant adnexal masses. There are tortuous ectatic vessels in both sides of pelvis, more so on the left side suggesting possible pelvic congestion syndrome. Other: There is no ascites or pneumoperitoneum. Umbilical hernia containing fat is seen. Musculoskeletal: No acute findings are seen. Few scattered sclerotic densities in both proximal femurs appears stable, possibly benign bone islands. IMPRESSION: There is no evidence of intestinal obstruction or pneumoperitoneum. There is no hydronephrosis. Enlarged liver. There are ectatic vessels in both sides of pelvis, more so on the left side suggesting pelvic congestion syndrome. Other findings as described in the body of the report. Electronically Signed   By: Ernie Avena M.D.   On: 06/11/2022 12:45    ROS 10 point review of systems is negative except as listed above in HPI.   Physical Exam Blood pressure 115/85, pulse 86, temperature 98.5 F (36.9 C), temperature source Oral, resp. rate 17, height 5\' 9"  (1.753 m), weight 59.9 kg, SpO2 98 %, unknown if currently breastfeeding. Constitutional: well-developed, well-nourished HEENT: pupils equal, round, reactive to light, 58mm b/l, moist conjunctiva, external inspection of ears and nose normal, hearing intact Oropharynx: normal oropharyngeal mucosa, normal dentition Neck: no thyromegaly, trachea midline, no midline cervical tenderness to palpation Chest: breath sounds equal bilaterally, normal respiratory effort, no midline or lateral chest wall tenderness to palpation/deformity Abdomen: soft, RLQ and suprapubic TTP, no bruising, no hepatosplenomegaly GU: normal female genitalia  Back: no wounds, no thoracic/lumbar spine tenderness to palpation, no thoracic/lumbar spine  stepoffs Rectal: deferred Extremities: 2+ radial and pedal pulses bilaterally, intact motor and sensation bilateral UE and LE, no peripheral edema MSK: unable to assess gait/station, no clubbing/cyanosis of fingers/toes, normal ROM of all four extremities Skin: warm, dry, no rashes Psych: normal memory, normal mood/affect     Assessment/Plan: 47F with RLQ abdominal pain of unclear etiology. Appendix not visualized on CT, but no stranding/inflammatory changes nearby. No fever, normal WBC, normal UA. Admit for observation, NPO. No abx, recheck CBC/abd exam in AM.    3m, MD General and Trauma Surgery [facility]

## 2022-06-11 NOTE — ED Provider Notes (Signed)
[facility] EMERGENCY DEPARTMENT Provider Note   CSN: [id number] Arrival date & time: 06/11/22  1007     History  Chief Complaint  Patient presents with   Abdominal Pain    Tanicia Wolaver is a 31 y.o. female.  The history is provided by the patient and medical records. No language interpreter was used.  Abdominal Pain Pain location:  RLQ Pain quality: aching   Pain radiates to:  Does not radiate Pain severity:  Severe Onset quality:  Gradual Duration:  5 days Timing:  Constant Progression:  Worsening Chronicity:  New Context: not previous surgeries and not trauma   Relieved by:  Nothing Worsened by:  Palpation Ineffective treatments:  None tried Associated symptoms: anorexia, chills and nausea   Associated symptoms: no chest pain, no constipation, no cough, no diarrhea, no dysuria, no fatigue, no fever, no hematuria, no shortness of breath, no sore throat, no vaginal bleeding, no vaginal discharge and no vomiting        Home Medications Prior to Admission medications   Medication Sig Start Date End Date Taking? Authorizing Provider  Multiple Vitamin (MULTIVITAMIN) tablet Take 1 tablet by mouth daily.    [provider]  norgestimate-ethinyl estradiol (ORTHO-CYCLEN) 0.25-35 MG-MCG tablet Take 1 tablet by mouth daily. 08/31/21   Marylene Land, CNM  nortriptyline (PAMELOR) 25 MG capsule Take 1 capsule by mouth at bedtime 03/08/22   Drema Dallas, DO  omeprazole (PRILOSEC) 40 MG capsule Take 1 capsule (40 mg total) by mouth daily. 08/13/20 09/12/20  Liberty Handy, PA-C  SUMAtriptan (IMITREX) 100 MG tablet Take 1 tablet (100 mg total) by mouth as needed for up to 1 dose for migraine (May repeat after 2 hours. Maximum 2 tablets in 24 hours.). May repeat in 2 hours if headache persists or recurs. 02/23/22   Drema Dallas, DO      Allergies    Acetaminophen    Review of Systems   Review of Systems  Constitutional:  Positive for  chills. Negative for fatigue and fever.  HENT:  Negative for congestion, ear pain and sore throat.   Eyes:  Negative for pain and visual disturbance.  Respiratory:  Negative for cough, chest tightness and shortness of breath.   Cardiovascular:  Negative for chest pain and palpitations.  Gastrointestinal:  Positive for abdominal pain, anorexia and nausea. Negative for constipation, diarrhea and vomiting.  Genitourinary:  Negative for dysuria, flank pain, frequency, hematuria, pelvic pain, vaginal bleeding and vaginal discharge.  Musculoskeletal:  Negative for arthralgias and back pain.  Skin:  Negative for color change, rash and wound.  Neurological:  Negative for seizures, syncope, light-headedness and headaches.  All other systems reviewed and are negative.   Physical Exam Updated Vital Signs BP 123/82 (BP Location: Right Arm)   Pulse 71   Temp 98.8 F (37.1 C) (Oral)   Resp 18   SpO2 98%  Physical Exam Vitals and nursing note reviewed.  Constitutional:      General: She is not in acute distress.    Appearance: She is well-developed. She is not ill-appearing, toxic-appearing or diaphoretic.  HENT:     Head: Normocephalic and atraumatic.     Mouth/Throat:     Mouth: Mucous membranes are moist.  Eyes:     Conjunctiva/sclera: Conjunctivae normal.  Cardiovascular:     Rate and Rhythm: Normal rate and regular rhythm.     Heart sounds: No murmur heard. Pulmonary:     Effort: Pulmonary effort is  normal. No respiratory distress.     Breath sounds: Normal breath sounds. No wheezing, rhonchi or rales.  Chest:     Chest wall: No tenderness.  Abdominal:     General: Abdomen is flat. There is no distension.     Palpations: Abdomen is soft.     Tenderness: There is abdominal tenderness in the right lower quadrant, periumbilical area and suprapubic area. There is no right CVA tenderness or left CVA tenderness.  Musculoskeletal:        General: No swelling.     Cervical back: Neck  supple.  Skin:    General: Skin is warm and dry.     Capillary Refill: Capillary refill takes less than 2 seconds.  Neurological:     General: No focal deficit present.     Mental Status: She is alert.  Psychiatric:        Mood and Affect: Mood normal.     ED Results / Procedures / Treatments   Labs (all labs ordered are listed, but only abnormal results are displayed) Labs Reviewed  CBC WITH DIFFERENTIAL/PLATELET  COMPREHENSIVE METABOLIC PANEL  LIPASE, BLOOD  URINALYSIS, ROUTINE W REFLEX MICROSCOPIC  HIV ANTIBODY (ROUTINE TESTING W REFLEX)  BASIC METABOLIC PANEL  CBC  I-STAT BETA HCG BLOOD, ED ([facility], [facility], [facility] ONLY)    EKG None  Radiology CT Abdomen Pelvis W Contrast  Result Date: 06/11/2022 CLINICAL DATA:  Pain right lower quadrant of abdomen EXAM: CT ABDOMEN AND PELVIS WITH CONTRAST TECHNIQUE: Multidetector CT imaging of the abdomen and pelvis was performed using the standard protocol following bolus administration of intravenous contrast. RADIATION DOSE REDUCTION: This exam was performed according to the departmental dose-optimization program which includes automated exposure control, adjustment of the mA and/or kV according to patient size and/or use of iterative reconstruction technique. CONTRAST:  OMNIPAQUE IOHEXOL 300 MG/ML  SOLN COMPARISON:  06/06/2021 FINDINGS: Lower chest: Unremarkable. Hepatobiliary: Liver is enlarged measuring 20.7 cm in length. There is no dilation of bile ducts. There are few small subcentimeter low-density foci, possibly cysts or hemangiomas. Gallbladder is not distended. Pancreas: There is slight prominence of pancreatic duct. No focal abnormalities are seen. Spleen: Unremarkable. Adrenals/Urinary Tract: Adrenals are unremarkable. There is no hydronephrosis. There are no renal or ureteral stones. Urinary bladder is not distended. Stomach/Bowel: Stomach is not distended. Small bowel loops are unremarkable. The appendix is not seen. There is no  pericecal inflammation. There is no significant wall thickening in colon. There is no pericolic stranding. Vascular/Lymphatic: Unremarkable. Reproductive: Uterus is unremarkable. There are no dominant adnexal masses. There are tortuous ectatic vessels in both sides of pelvis, more so on the left side suggesting possible pelvic congestion syndrome. Other: There is no ascites or pneumoperitoneum. Umbilical hernia containing fat is seen. Musculoskeletal: No acute findings are seen. Few scattered sclerotic densities in both proximal femurs appears stable, possibly benign bone islands. IMPRESSION: There is no evidence of intestinal obstruction or pneumoperitoneum. There is no hydronephrosis. Enlarged liver. There are ectatic vessels in both sides of pelvis, more so on the left side suggesting pelvic congestion syndrome. Other findings as described in the body of the report. Electronically Signed   By: Ernie Avena M.D.   On: 06/11/2022 12:45    Procedures Procedures    Medications Ordered in ED Medications  enoxaparin (LOVENOX) injection 40 mg (has no administration in time range)  lactated ringers infusion ( Intravenous New Bag/Given 06/11/22 2112)  acetaminophen (TYLENOL) tablet 1,000 mg (has no administration in time range)  methocarbamol (ROBAXIN) tablet 750 mg (750 mg Oral Given 06/11/22 2109)    Or  methocarbamol (ROBAXIN) 750 mg in dextrose 5 % 50 mL IVPB ( Intravenous See Alternative 06/11/22 2109)  docusate sodium (COLACE) capsule 100 mg (100 mg Oral Given 06/11/22 2108)  ondansetron (ZOFRAN-ODT) disintegrating tablet 4 mg (has no administration in time range)    Or  ondansetron (ZOFRAN) injection 4 mg (has no administration in time range)  ketorolac (TORADOL) 15 MG/ML injection 30 mg (30 mg Intravenous Given 06/11/22 2207)  iohexol (OMNIPAQUE) 300 MG/ML solution 100 mL (100 mLs Intravenous Contrast Given 06/11/22 1232)  morphine (PF) 4 MG/ML injection 4 mg (4 mg Intravenous Given 06/11/22  1910)  ondansetron (ZOFRAN) injection 4 mg (4 mg Intravenous Given 06/11/22 1910)  sodium chloride 0.9 % bolus 1,000 mL (0 mLs Intravenous Stopped 06/11/22 2202)    ED Course/ Medical Decision Making/ A&P                           Medical Decision Making Risk Prescription drug management. Decision regarding hospitalization.    Karilynn Carranza is a 31 y.o. 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  Patient says that for the last 5 or 6 days she has had pain that started in her bellybutton and radiated down her right lower quadrant.  It is not lower and pelvic and she denies any vaginal discharge or vaginal bleeding.  Denies any dysuria or urinary changes.  Denies history of kidney stones.  Reports the pain is up to 9 out of 10.  She has had some subjective chills and some nausea.  She denies any trauma or rashes to suggest shingles.  She denies any congestion, cough, chest pain, shortness of breath.  Patient was sent from urgent care to rule out appendicitis.  Patient has been waiting approximately 8 hours and 45 minutes prior to my initial evaluation.   On exam she is very tender in her right lower quadrant primarily.  The rest of the abdomen is nontender.  Bowel sounds are appreciated.  No flank or back tenderness.  No rash seen.  Patient has a dry mouth and she reports he has not had much to eat or drink today due to the nausea.  She still has the 9 out of 10 pain.  Patient had work-up starting in triage including labs and a CT abdomen pelvis.  Her labs returned revealing she is not pregnant.  Lipase normal.  CBC and CMP unremarkable.  Urinalysis is totally clear.    CT scan returned and the appendix was not visualized.  There is no hydronephrosis and no evidence of bowel obstruction or perforation.   She does have an enlarged liver slightly and has ectatic vessels going to her pelvis consistent with her reportedly known previous pelvic congestion syndrome.  She reports this pain is very different and she does not have any pelvic pain now, just right lower quadrant pain.  Alvarado score is a 5 suggestive of possible appendicitis by the score.  Given the patient's concern for migratory right lower quadrant pain, nausea, chills, decreased appetite, and CT not showing her appendix, we will call general surgery to discuss further management of suspected appendicitis.  General surgery will admit for further monitoring and management  Final Clinical Impression(s) / ED Diagnoses Final diagnoses:  RLQ abdominal pain    Clinical Impression: 1. RLQ abdominal pain     Disposition: Admit  This note was prepared with assistance of Dragon voice recognition software. Occasional wrong-word or sound-a-like substitutions may have occurred due to the inherent limitations of voice recognition software.        Shirely Toren, Canary Brim, MD 06/11/22 [phone]

## 2022-06-12 LAB — HIV ANTIBODY (ROUTINE TESTING W REFLEX): HIV Screen 4th Generation wRfx: NONREACTIVE

## 2022-06-12 LAB — CBC
HCT: 34.1 % — ABNORMAL LOW (ref 36.0–46.0)
Hemoglobin: 11.5 g/dL — ABNORMAL LOW (ref 12.0–15.0)
MCH: 31.2 pg (ref 26.0–34.0)
MCHC: 33.7 g/dL (ref 30.0–36.0)
MCV: 92.4 fL (ref 80.0–100.0)
Platelets: 240 10*3/uL (ref 150–400)
RBC: 3.69 MIL/uL — ABNORMAL LOW (ref 3.87–5.11)
RDW: 12 % (ref 11.5–15.5)
WBC: 7.4 10*3/uL (ref 4.0–10.5)
nRBC: 0 % (ref 0.0–0.2)

## 2022-06-12 LAB — BASIC METABOLIC PANEL
Anion gap: 7 (ref 5–15)
BUN: 10 mg/dL (ref 6–20)
CO2: 24 mmol/L (ref 22–32)
Calcium: 8.4 mg/dL — ABNORMAL LOW (ref 8.9–10.3)
Chloride: 108 mmol/L (ref 98–111)
Creatinine, Ser: 1.02 mg/dL — ABNORMAL HIGH (ref 0.44–1.00)
GFR, Estimated: >60 mL/min (ref >60)
Glucose, Bld: 87 mg/dL (ref 70–99)
Potassium: 3.7 mmol/L (ref 3.5–5.1)
Sodium: 139 mmol/L (ref 135–145)

## 2022-06-12 NOTE — Progress Notes (Signed)
Patient arrives to the unit around 04:30 AM via wheelchair,alert and oriented x4,patient welcome to her room and educated on safety by using the call light.Patient skin assessment done,medicated for pain

## 2022-06-12 NOTE — Plan of Care (Signed)

## 2022-06-12 NOTE — Plan of Care (Signed)
Pt admitted for abdominal pain.

## 2022-06-12 NOTE — Progress Notes (Signed)
Subjective: CC: Just woke up. Reports her abdominal pain has been ongoing for about a week (since Tue/Wed of last week) starting near the umbilicus and moving towards the RLQ on Saturday. Some nausea, decreased appetite but no vomiting or fever. Normal bm for her on Saturday. No diarrhea. No urinary symptoms, vaginal bleeding or vaginal discharge. 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   Pain since admit has improved, still located in the RLQ but much less severe. Still some nausea but again just woke up and also reports she doesn't know if this is just because she hasn't eaten and is hungry. No vomiting overnight. Last prn pain medication was at 2207 yesterday (Toradol).   Afebrile overnight. Tachycardia resolved. No hypotension. WBC remains wnl.   Objective: Vital signs in last 24 hours: Temp:  [98 F (36.7 C)-98.8 F (37.1 C)] 98 F (36.7 C) (07/18 0439) Pulse Rate:  [59-109] 71 (07/18 0439) Resp:  [10-23] 13 (07/18 0405) BP: (97-133)/(60-107) 107/74 (07/18 0439) SpO2:  [97 %-100 %] 100 % (07/18 0439) Weight:  [59.9 kg] 59.9 kg (07/17 1930) Last BM Date : 06/09/22  Intake/Output from previous day: No intake/output data recorded. Intake/Output this shift: No intake/output data recorded.  PE: Gen:  Alert, NAD, pleasant Card:  Reg Pulm:  CTAB, no W/R/R, effort normal Abd: Soft, ND, some suprapubic and RLQ ttp without rigidity or guarding - otherwise abdomen is NT. No RUQ ttp. +BS Psych: A&Ox3  Skin: no rashes noted, warm and dry  Lab Results:  Recent Labs    06/11/22 1023 06/12/22 0358  WBC 8.4 7.4  HGB 14.0 11.5*  HCT 41.6 34.1*  PLT 303 240   BMET Recent Labs    06/11/22 1023 06/12/22 0358  NA 139 139  K 4.3 3.7  CL 105 108  CO2 25 24  GLUCOSE 84 87  BUN 8 10  CREATININE 1.00 1.02*  CALCIUM 9.4 8.4*   PT/INR No results for input(s): "LABPROT", "INR" in the last 72 hours. CMP     Component Value Date/Time   NA 139  06/12/2022 0358   K 3.7 06/12/2022 0358   CL 108 06/12/2022 0358   CO2 24 06/12/2022 0358   GLUCOSE 87 06/12/2022 0358   BUN 10 06/12/2022 0358   CREATININE 1.02 (H) 06/12/2022 0358   CALCIUM 8.4 (L) 06/12/2022 0358   PROT 7.5 06/11/2022 1023   ALBUMIN 4.1 06/11/2022 1023   AST 19 06/11/2022 1023   ALT 12 06/11/2022 1023   ALKPHOS 44 06/11/2022 1023   BILITOT 0.4 06/11/2022 1023   GFRNONAA >60 06/12/2022 0358   GFRAA >60 08/12/2020 2009   Lipase     Component Value Date/Time   LIPASE 39 06/11/2022 1023    Studies/Results: CT Abdomen Pelvis W Contrast  Result Date: 06/11/2022 CLINICAL DATA:  Pain right lower quadrant of abdomen EXAM: CT ABDOMEN AND PELVIS WITH CONTRAST TECHNIQUE: Multidetector CT imaging of the abdomen and pelvis was performed using the standard protocol following bolus administration of intravenous contrast. RADIATION DOSE REDUCTION: This exam was performed according to the departmental dose-optimization program which includes automated exposure control, adjustment of the mA and/or kV according to patient size and/or use of iterative reconstruction technique. CONTRAST:  OMNIPAQUE IOHEXOL 300 MG/ML  SOLN COMPARISON:  06/06/2021 FINDINGS: Lower chest: Unremarkable. Hepatobiliary: Liver is enlarged measuring 20.7 cm in length. There is no dilation of bile ducts. There are few small subcentimeter low-density foci, possibly cysts or hemangiomas.  Gallbladder is not distended. Pancreas: There is slight prominence of pancreatic duct. No focal abnormalities are seen. Spleen: Unremarkable. Adrenals/Urinary Tract: Adrenals are unremarkable. There is no hydronephrosis. There are no renal or ureteral stones. Urinary bladder is not distended. Stomach/Bowel: Stomach is not distended. Small bowel loops are unremarkable. The appendix is not seen. There is no pericecal inflammation. There is no significant wall thickening in colon. There is no pericolic stranding. Vascular/Lymphatic:  Unremarkable. Reproductive: Uterus is unremarkable. There are no dominant adnexal masses. There are tortuous ectatic vessels in both sides of pelvis, more so on the left side suggesting possible pelvic congestion syndrome. Other: There is no ascites or pneumoperitoneum. Umbilical hernia containing fat is seen. Musculoskeletal: No acute findings are seen. Few scattered sclerotic densities in both proximal femurs appears stable, possibly benign bone islands. IMPRESSION: There is no evidence of intestinal obstruction or pneumoperitoneum. There is no hydronephrosis. Enlarged liver. There are ectatic vessels in both sides of pelvis, more so on the left side suggesting pelvic congestion syndrome. Other findings as described in the body of the report. Electronically Signed   By: Ernie Avena M.D.   On: 06/11/2022 12:45    Anti-infectives: Anti-infectives (From admission, onward)    None        Assessment/Plan RLQ pain of unclear etiology This is a 31 y.o. female with ~ 1 week of abdominal pain. CT yesterday showed no clear etiology to explain her abdominal pain but the appendix was not visualized and with her RLQ ttp she was admitted overnight for observation. Since admit her abdominal pain has improved. She still has some ttp of the RLQ but no peritonitis. She is not requiring any prn pain medication since 2207 yesterday. She is afebrile, without tachycardia or hypotension this am. WBC remains wnl both on admission labs and this am (she was observed off abx). While the appendix is not visualized on CT I would expect some nearby stranding/inflammatory changes in on CT if this was appendicitis, especially with her symptoms going on for ~1 week. After discussion with the patient she agrees with plan for PO challenge and if tolerates discharge home with strict return precautions to return if any of her symptoms worsen.   FEN - Reg VTE - SCDs, Lovenox ID - None  I reviewed nursing notes, ED provider  notes, last 24 h vitals and pain scores, last 48 h intake and output, last 24 h labs and trends, and last 24 h imaging results.   LOS: 0 days    Jacinto Halim , [facility] 06/12/2022, 8:45 AM Please see Amion for pager number during day hours 7:00am-4:30pm

## 2022-06-12 NOTE — Discharge Summary (Signed)
    Patient ID: Kristina Dunn [id number] October 19, 1991 31 y.o.  Admit date: 06/11/2022 Discharge date: 06/12/2022  Admitting Diagnosis: RLQ abdominal pain  Discharge Diagnosis Patient Active Problem List   Diagnosis Date Noted   Abdominal pain 06/11/2022    Consultants none  Reason for Admission: 65F with abdominal pain since the middle of last week. Pain localized initially to right infraumbilical region and intermittent, now in the RLQ, worse in intensity, and constant. Associated nausea without vomiting, "hot flashes". Last BM 7/15 AM and BMs have been "normal to me". Her normal is once every few days. Describes stools as soft and brown. No urinary symptoms. No prior abdominal surgery. LMP 05/30/22. No prior colonoscopy. No sick contacts. No viral/URI symptoms. No personal history or IBD/IBS, Crohns, UC, or colon cancer. Unsure of family history, but does not believe any family history of the above either.   Procedures none  Hospital Course:  The patient was admitted and observed.  Her WBC remained normal and her RLQ abdominal pain improved.  She was able to tolerate a solid diet.  She was improved and not felt to have symptoms related to appendicitis.  Strict return precautions were discussed with the patient and she stated understanding.  I did not participate in this patient's care.  Information was obtained by chart review.  Allergies as of 06/12/2022       Reactions   Acetaminophen Nausea And Vomiting   And causes severe abdominal pain        Medication List     TAKE these medications    multivitamin tablet Take 1 tablet by mouth daily.   norgestimate-ethinyl estradiol 0.25-35 MG-MCG tablet Commonly known as: ORTHO-CYCLEN Take 1 tablet by mouth daily.   nortriptyline 25 MG capsule Commonly known as: PAMELOR Take 1 capsule by mouth at bedtime   omeprazole 40 MG capsule Commonly known as: PRILOSEC Take 1 capsule (40 mg total) by mouth daily.   SUMAtriptan 100  MG tablet Commonly known as: IMITREX Take 1 tablet (100 mg total) by mouth as needed for up to 1 dose for migraine (May repeat after 2 hours. Maximum 2 tablets in 24 hours.). May repeat in 2 hours if headache persists or recurs.          Follow-up Information     Primary care doctor as needed Follow up.                  Signed: Barnetta Chapel, [facility] 06/12/2022, 12:00 PM Please see Amion for pager number during day hours 7:00am-4:30pm, 7-11:30am on Weekends

## 2022-06-12 NOTE — ED Notes (Signed)
ED TO INPATIENT HANDOFF REPORT  ED Nurse Name and Phone #: [phone]  S Name/Age/Gender Kristina Dunn 31 y.o. female Room/Bed: 043C/043C  Code Status   Code Status: Full Code  Home/SNF/Other Home Patient oriented to: self, place, time, and situation Is this baseline? Yes   Triage Complete: Triage complete  Chief Complaint Abdominal pain [R10.9]  Triage Note Pt states she has been having right lower abd pain that has worsened over the past week. Having nausea. Pt went to urgent care but was sent here for further evaluation. Pt was given zofran for nausea at UC.    Allergies Allergies  Allergen Reactions   Acetaminophen Nausea And Vomiting    And causes severe abdominal pain    Level of Care/Admitting Diagnosis ED Disposition     ED Disposition  Admit   Condition  --   Comment  Hospital Area: [facility] [[id number]]  Level of Care: Med-Surg [16]  May place patient in observation at [facility] or [facility] if equivalent level of care is available:: No  Covid Evaluation: Asymptomatic - no recent exposure (last 10 days) testing not required  Diagnosis: Abdominal pain [160109]  Admitting Physician: CCS, MD [[id number]]  Attending Physician: CCS, MD [[id number]]          B Medical/Surgery History Past Medical History:  Diagnosis Date   Medical history non-contributory    Past Surgical History:  Procedure Laterality Date   WISDOM TOOTH EXTRACTION       A IV Location/Drains/Wounds Patient Lines/Drains/Airways Status     Active Line/Drains/Airways     Name Placement date Placement time Site Days   Peripheral IV 06/11/22 20 G Left Antecubital 06/11/22  1022  Antecubital  1   Wound 12/06/12 Laceration Wrist Posterior;Right 12/06/12  0246  Wrist  3475            Intake/Output Last 24 hours No intake or output data in the 24 hours ending 06/12/22 0048  Labs/Imaging Results for orders placed or performed during the hospital encounter of 06/11/22  (from the past 48 hour(s))  Urinalysis, Routine w reflex microscopic Urine, Clean Catch     Status: None   Collection Time: 06/11/22 10:17 AM  Result Value Ref Range   Color, Urine YELLOW YELLOW   APPearance CLEAR CLEAR   Specific Gravity, Urine 1.008 1.005 - 1.030   pH 6.0 5.0 - 8.0   Glucose, UA NEGATIVE NEGATIVE mg/dL   Hgb urine dipstick NEGATIVE NEGATIVE   Bilirubin Urine NEGATIVE NEGATIVE   Ketones, ur NEGATIVE NEGATIVE mg/dL   Protein, ur NEGATIVE NEGATIVE mg/dL   Nitrite NEGATIVE NEGATIVE   Leukocytes,Ua NEGATIVE NEGATIVE    Comment: Performed at [facility], [address] [place], [place] [address]  CBC with Differential     Status: None   Collection Time: 06/11/22 10:23 AM  Result Value Ref Range   WBC 8.4 4.0 - 10.5 K/uL   RBC 4.49 3.87 - 5.11 MIL/uL   Hemoglobin 14.0 12.0 - 15.0 g/dL   HCT 73.2 20.2 - 54.2 %   MCV 92.7 80.0 - 100.0 fL   MCH 31.2 26.0 - 34.0 pg   MCHC 33.7 30.0 - 36.0 g/dL   RDW 70.6 23.7 - 62.8 %   Platelets 303 150 - 400 K/uL   nRBC 0.0 0.0 - 0.2 %   Neutrophils Relative % 74 %   Neutro Abs 6.2 1.7 - 7.7 K/uL   Lymphocytes Relative 18 %   Lymphs Abs  1.5 0.7 - 4.0 K/uL   Monocytes Relative 5 %   Monocytes Absolute 0.4 0.1 - 1.0 K/uL   Eosinophils Relative 2 %   Eosinophils Absolute 0.2 0.0 - 0.5 K/uL   Basophils Relative 1 %   Basophils Absolute 0.1 0.0 - 0.1 K/uL   Immature Granulocytes 0 %   Abs Immature Granulocytes 0.03 0.00 - 0.07 K/uL    Comment: Performed at [facility], [place] [address], [place], [place] [address]  Comprehensive metabolic panel     Status: None   Collection Time: 06/11/22 10:23 AM  Result Value Ref Range   Sodium 139 135 - 145 mmol/L   Potassium 4.3 3.5 - 5.1 mmol/L   Chloride 105 98 - 111 mmol/L   CO2 25 22 - 32 mmol/L   Glucose, Bld 84 70 - 99 mg/dL    Comment: Glucose reference range applies only to samples taken after fasting for at least 8 hours.   BUN 8 6 - 20 mg/dL   Creatinine, Ser 1.00  0.44 - 1.00 mg/dL   Calcium 9.4 8.9 - 10.3 mg/dL   Total Protein 7.5 6.5 - 8.1 g/dL   Albumin 4.1 3.5 - 5.0 g/dL   AST 19 15 - 41 U/L   ALT 12 0 - 44 U/L   Alkaline Phosphatase 44 38 - 126 U/L   Total Bilirubin 0.4 0.3 - 1.2 mg/dL   GFR, Estimated >60 >60 mL/min    Comment: (NOTE) Calculated using the CKD-EPI Creatinine Equation (2021)    Anion gap 9 5 - 15    Comment: Performed at [facility] [address], [place], [place] [address]  Lipase, blood     Status: None   Collection Time: 06/11/22 10:23 AM  Result Value Ref Range   Lipase 39 11 - 51 U/L    Comment: Performed at [facility] [address], [place], [place] [address]  POC beta hCG blood, ED     Status: None   Collection Time: 06/11/22 10:34 AM  Result Value Ref Range   I-stat hCG, quantitative <5.0 <5 mIU/mL   Comment 3            Comment:   GEST. AGE      CONC.  (mIU/mL)   <=1 WEEK        5 - 50     2 WEEKS       50 - 500     3 WEEKS       100 - 10,000     4 WEEKS     1,000 - 30,000        FEMALE AND NON-PREGNANT FEMALE:     LESS THAN 5 mIU/mL    CT Abdomen Pelvis W Contrast  Result Date: 06/11/2022 CLINICAL DATA:  Pain right lower quadrant of abdomen EXAM: CT ABDOMEN AND PELVIS WITH CONTRAST TECHNIQUE: Multidetector CT imaging of the abdomen and pelvis was performed using the standard protocol following bolus administration of intravenous contrast. RADIATION DOSE REDUCTION: This exam was performed according to the departmental dose-optimization program which includes automated exposure control, adjustment of the mA and/or kV according to patient size and/or use of iterative reconstruction technique. CONTRAST:  148mL OMNIPAQUE IOHEXOL 300 MG/ML  SOLN COMPARISON:  06/06/2021 FINDINGS: Lower chest: Unremarkable. Hepatobiliary: Liver is enlarged measuring 20.7 cm in length. There is no dilation of bile ducts. There are few small subcentimeter low-density foci, possibly cysts or hemangiomas. Gallbladder is  not distended.  Pancreas: There is slight prominence of pancreatic duct. No focal abnormalities are seen. Spleen: Unremarkable. Adrenals/Urinary Tract: Adrenals are unremarkable. There is no hydronephrosis. There are no renal or ureteral stones. Urinary bladder is not distended. Stomach/Bowel: Stomach is not distended. Small bowel loops are unremarkable. The appendix is not seen. There is no pericecal inflammation. There is no significant wall thickening in colon. There is no pericolic stranding. Vascular/Lymphatic: Unremarkable. Reproductive: Uterus is unremarkable. There are no dominant adnexal masses. There are tortuous ectatic vessels in both sides of pelvis, more so on the left side suggesting possible pelvic congestion syndrome. Other: There is no ascites or pneumoperitoneum. Umbilical hernia containing fat is seen. Musculoskeletal: No acute findings are seen. Few scattered sclerotic densities in both proximal femurs appears stable, possibly benign bone islands. IMPRESSION: There is no evidence of intestinal obstruction or pneumoperitoneum. There is no hydronephrosis. Enlarged liver. There are ectatic vessels in both sides of pelvis, more so on the left side suggesting pelvic congestion syndrome. Other findings as described in the body of the report. Electronically Signed   By: Ernie Avena M.D.   On: 06/11/2022 12:45    Pending Labs Unresulted Labs (From admission, onward)     Start     Ordered   06/12/22 0500  Basic metabolic panel  Tomorrow morning,   R        06/11/22 2050   06/12/22 0500  CBC  Tomorrow morning,   R        06/11/22 2050   06/11/22 2048  HIV Antibody (routine testing w rflx)  (HIV Antibody (Routine testing w reflex) panel)  Once,   R        06/11/22 2050            Vitals/Pain Today's Vitals   06/11/22 2230 06/11/22 2330 06/12/22 0000 06/12/22 0030  BP: 113/74 108/74 104/75 99/60  Pulse: 68 68 68 70  Resp: 17 13 14 14   Temp:      TempSrc:      SpO2: 99% 97%  97% 97%  Weight:      Height:      PainSc:        Isolation Precautions No active isolations  Medications Medications  enoxaparin (LOVENOX) injection 40 mg (has no administration in time range)  lactated ringers infusion ( Intravenous New Bag/Given 06/11/22 2112)  acetaminophen (TYLENOL) tablet 1,000 mg (1,000 mg Oral Not Given 06/11/22 2304)  methocarbamol (ROBAXIN) tablet 750 mg (750 mg Oral Given 06/11/22 2109)    Or  methocarbamol (ROBAXIN) 750 mg in dextrose 5 % 50 mL IVPB ( Intravenous See Alternative 06/11/22 2109)  docusate sodium (COLACE) capsule 100 mg (100 mg Oral Given 06/11/22 2108)  ondansetron (ZOFRAN-ODT) disintegrating tablet 4 mg (has no administration in time range)    Or  ondansetron (ZOFRAN) injection 4 mg (has no administration in time range)  ketorolac (TORADOL) 15 MG/ML injection 30 mg (30 mg Intravenous Given 06/11/22 2207)  iohexol (OMNIPAQUE) 300 MG/ML solution 100 mL (100 mLs Intravenous Contrast Given 06/11/22 1232)  morphine (PF) 4 MG/ML injection 4 mg (4 mg Intravenous Given 06/11/22 1910)  ondansetron (ZOFRAN) injection 4 mg (4 mg Intravenous Given 06/11/22 1910)  sodium chloride 0.9 % bolus 1,000 mL (0 mLs Intravenous Stopped 06/11/22 2202)    Mobility walks Low fall risk   Focused Assessments Neuro Assessment Handoff:  Swallow screen pass? Yes          Neuro Assessment: Within Defined Limits Neuro Checks:  Last Documented NIHSS Modified Score:   Has TPA been given? No If patient is a Neuro Trauma and patient is going to OR before floor call report to [place] nurse: [phone] or [phone]   R Recommendations: See Admitting Provider Note  Report given to:   Additional Notes: pt is AAOx4. Pt is on room air. Pt is ambulatory.

## 2022-06-12 NOTE — Progress Notes (Signed)
Patient admitted for abdominal pain. Pain has subsided, appetite has returned with no nausea/vomiting or pain. Findings not consistent with Appendicitis. All personal belongings returned at bedside. IV to left antecubital space discontinued with tip intact, no s/s of infection and gauze dressing placed. Teaching provided with verbalized understanding.  Discharged to home with friend at her side via personal car.

## 2022-08-08 ENCOUNTER — Encounter: Payer: Self-pay | Admitting: Student

## 2022-08-16 ENCOUNTER — Other Ambulatory Visit: Payer: Self-pay | Admitting: Neurology

## 2022-08-23 ENCOUNTER — Telehealth: Payer: Self-pay

## 2022-08-23 DIAGNOSIS — Z3041 Encounter for surveillance of contraceptive pills: Secondary | ICD-10-CM

## 2022-08-23 NOTE — Telephone Encounter (Signed)
VM left on nurse line requesting refill of birth control pills. States she is out of pills and appt with our office is a few weeks away.

## 2022-08-24 MED ORDER — NORGESTIMATE-ETH ESTRADIOL 0.25-35 MG-MCG PO TABS: 1.0000 | ORAL_TABLET | Freq: Every day | ORAL | 0 refills | Status: AC

## 2022-08-24 NOTE — Addendum Note (Signed)
Addended by: Louisa Second E on: 08/24/2022 11:18 AM   Modules accepted: Orders

## 2022-08-24 NOTE — Telephone Encounter (Signed)
Called pt to notify her I have sent single pack of pills. Pt to return 08/29/22 for annual visit.
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
## 2022-08-29 ENCOUNTER — Ambulatory Visit (INDEPENDENT_AMBULATORY_CARE_PROVIDER_SITE_OTHER): Payer: Medicaid Other | Admitting: Certified Nurse Midwife

## 2022-08-29 DIAGNOSIS — Z01419 Encounter for gynecological examination (general) (routine) without abnormal findings: Secondary | ICD-10-CM

## 2022-08-29 NOTE — Progress Notes (Signed)
Pt did not come to her appt or call to cancel

## 2022-08-31 ENCOUNTER — Ambulatory Visit: Payer: Medicaid Other | Admitting: Neurology

## 2022-08-31 ENCOUNTER — Encounter: Payer: Self-pay | Admitting: Neurology

## 2022-08-31 VITALS — BP 128/87 | HR 86 | Resp 18 | Ht 69.0 in | Wt 131.0 lb

## 2022-08-31 DIAGNOSIS — G43009 Migraine without aura, not intractable, without status migrainosus: Secondary | ICD-10-CM

## 2022-08-31 MED ORDER — NORTRIPTYLINE HCL 25 MG PO CAPS: 25.0000 mg | ORAL_CAPSULE | Freq: Every day | ORAL | 3 refills | Status: DC

## 2022-08-31 NOTE — Patient Instructions (Signed)
Continue nortriptyline 25mg  at bedtime Stop sumatriptan.  Ibuprofen as needed.  Limit use of pain relievers to no more than 2 days out of week to prevent risk of rebound or medication-overuse headache. Follow up one year

## 2022-09-12 ENCOUNTER — Encounter (HOSPITAL_COMMUNITY): Payer: Self-pay | Admitting: Psychiatry

## 2022-09-12 ENCOUNTER — Ambulatory Visit (INDEPENDENT_AMBULATORY_CARE_PROVIDER_SITE_OTHER): Payer: Medicaid Other | Admitting: Psychiatry

## 2022-09-12 DIAGNOSIS — F411 Generalized anxiety disorder: Secondary | ICD-10-CM

## 2022-09-12 DIAGNOSIS — F4310 Post-traumatic stress disorder, unspecified: Secondary | ICD-10-CM | POA: Diagnosis not present

## 2022-09-12 DIAGNOSIS — F322 Major depressive disorder, single episode, severe without psychotic features: Secondary | ICD-10-CM

## 2022-09-12 MED ORDER — NORTRIPTYLINE HCL 75 MG PO CAPS: 75.0000 mg | ORAL_CAPSULE | Freq: Every day | ORAL | 3 refills | Status: DC

## 2022-09-12 MED ORDER — HYDROXYZINE HCL 10 MG PO TABS: 10.0000 mg | ORAL_TABLET | Freq: Three times a day (TID) | ORAL | 3 refills | Status: DC | PRN

## 2022-09-12 MED ORDER — ARIPIPRAZOLE 5 MG PO TABS: 5.0000 mg | ORAL_TABLET | Freq: Every day | ORAL | 3 refills | Status: DC
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
## 2022-12-11 ENCOUNTER — Telehealth (HOSPITAL_COMMUNITY): Payer: Self-pay | Admitting: Student in an Organized Health Care Education/Training Program

## 2022-12-14 ENCOUNTER — Encounter (HOSPITAL_COMMUNITY): Payer: Self-pay

## 2022-12-14 ENCOUNTER — Encounter (HOSPITAL_COMMUNITY): Payer: Medicaid Other | Admitting: Student in an Organized Health Care Education/Training Program

## 2022-12-30 ENCOUNTER — Other Ambulatory Visit (HOSPITAL_COMMUNITY): Payer: Self-pay | Admitting: Psychiatry

## 2022-12-30 DIAGNOSIS — F322 Major depressive disorder, single episode, severe without psychotic features: Secondary | ICD-10-CM

## 2022-12-30 DIAGNOSIS — F4310 Post-traumatic stress disorder, unspecified: Secondary | ICD-10-CM

## 2022-12-30 DIAGNOSIS — F411 Generalized anxiety disorder: Secondary | ICD-10-CM

## 2023-01-27 ENCOUNTER — Other Ambulatory Visit (HOSPITAL_COMMUNITY): Payer: Self-pay | Admitting: Psychiatry

## 2023-01-27 DIAGNOSIS — F4310 Post-traumatic stress disorder, unspecified: Secondary | ICD-10-CM

## 2023-01-27 DIAGNOSIS — F322 Major depressive disorder, single episode, severe without psychotic features: Secondary | ICD-10-CM

## 2023-01-27 DIAGNOSIS — F411 Generalized anxiety disorder: Secondary | ICD-10-CM

## 2023-02-11 ENCOUNTER — Encounter (HOSPITAL_COMMUNITY): Payer: Self-pay | Admitting: Psychiatry

## 2023-02-11 ENCOUNTER — Telehealth (INDEPENDENT_AMBULATORY_CARE_PROVIDER_SITE_OTHER): Payer: Medicaid Other | Admitting: Psychiatry

## 2023-02-11 DIAGNOSIS — F4310 Post-traumatic stress disorder, unspecified: Secondary | ICD-10-CM

## 2023-02-11 DIAGNOSIS — F411 Generalized anxiety disorder: Secondary | ICD-10-CM

## 2023-02-11 DIAGNOSIS — F322 Major depressive disorder, single episode, severe without psychotic features: Secondary | ICD-10-CM

## 2023-02-11 MED ORDER — BUPROPION HCL ER (XL) 150 MG PO TB24: 150.0000 mg | ORAL_TABLET | ORAL | 3 refills | Status: DC

## 2023-02-11 MED ORDER — NORTRIPTYLINE HCL 75 MG PO CAPS: 75.0000 mg | ORAL_CAPSULE | Freq: Every day | ORAL | 3 refills | Status: DC

## 2023-02-11 MED ORDER — HYDROXYZINE HCL 25 MG PO TABS: 25.0000 mg | ORAL_TABLET | Freq: Three times a day (TID) | ORAL | 3 refills | Status: DC | PRN

## 2023-02-11 MED ORDER — ARIPIPRAZOLE 10 MG PO TABS: 10.0000 mg | ORAL_TABLET | Freq: Every day | ORAL | 3 refills | Status: DC

## 2023-02-11 NOTE — Progress Notes (Signed)
[place] MD/PA/NP OP Progress Note Virtual Visit via Video Note  I connected with [name] on 02/11/23 at  9:30 AM EDT by a video enabled telemedicine application and verified that I am speaking with the correct person using two identifiers.  Location: Patient: Home Provider: Clinic   I discussed the limitations of evaluation and management by telemedicine and the availability of in person appointments. The patient expressed understanding and agreed to proceed.  I provided 30 minutes of non-face-to-face time during this encounter.   02/11/2023 12:15 PM Mohogany Sonday  MRN:  [id number]  Chief Complaint: "I have been anxious and stressed"  HPI: 32 year old female seen today for follow-up psychiatric evaluation.  She has a psychiatric history of Anxiety, Depression, and PTSD.  Patient is managed on nortriptyline 75 mg nightly, Abilify 5 mg daily, and hydroxyzine 10 mg 3 times daily.  She reports her medications are somewhat effective in managing her psychiatric condition.    Today she was well-groomed, pleasant, cooperative, engaged in conversation.  She informed Probation officer that she has been anxious and stressed due to life stressors.  Patient informed Probation officer that she and her boyfriend recently broke up.  She reports that her depression had a lot to do with their separation.  She notes that recently they purchased a home together and are currently living together which she reports is awkward.  Patient also informed writer that her depression has been presenting at work.  She notes that today her boss recommended that she go to HR.  She notes that HR recommended that she leave work and seek mental health treatment which she did.  Today provider conducted a GAD-7 and patient scored a 20, at her last visit she scored a 19.  Provider also conducted PHQ-9 and patient scored a 24, at her last visit she scored a 19.  She endorses sleeping approximately 4 to 5 hours nightly.  She endorses passive SI but denies  wanting to harm himself today.  Today she denies SI/HI/VAH, mania, paranoia.    Patient informed writer that in the past she took Adderall for concentration.  She notes that she believes she has ADHD.  Provider asked patient if Adderall was prescribed to her and if she had a formal diagnosis of ADHD.  She notes that the medication was not prescribed to her and she did not have a formal diagnosis of ADHD.  Patient does note that she is distractible, forgetful, and disorganized.  At times she reports that she is inattentive to mentally taxing past.  Provider informed patient that currently she is severely depressed and the symptoms may present as poor concentration.  She endorsed understanding and agreed.  To cope with above stressors patient notes that she vapes nicotine daily.  She denies any illegal drug use.  Patient reports that she took two Abilify in the past and found it more effective.  Today Abilify 5 mg increased to 10 mg to help manage mood.  Patient also agreeable to starting Wellbutrin XL 150 mg daily to help manage depression, nicotine dependence, and poor concentration.  Hydroxyzine 10 mg 3 times daily also increased to 25 mg 3 times daily to help manage anxiety. Potential side effects of medication and risks vs benefits of treatment vs non-treatment were explained and discussed. All questions were answered. She will follow-up with outpatient counseling for therapy.  No other concerns noted at this time. Visit Diagnosis:    ICD-10-CM   1. Severe depression (HCC)  F32.2 ARIPiprazole (ABILIFY) 10 MG tablet  nortriptyline (PAMELOR) 75 MG capsule    buPROPion (WELLBUTRIN XL) 150 MG 24 hr tablet    2. Generalized anxiety disorder  F41.1 hydrOXYzine (ATARAX) 25 MG tablet    nortriptyline (PAMELOR) 75 MG capsule    3. PTSD (post-traumatic stress disorder)  F43.10 nortriptyline (PAMELOR) 75 MG capsule      Past Psychiatric History: Anxiety, Depression, and PTSD Past Medical History:   Past Medical History:  Diagnosis Date   Medical history non-contributory     Past Surgical History:  Procedure Laterality Date   WISDOM TOOTH EXTRACTION      Family Psychiatric History: Father anxiety, depression, and PTSD, brother ADHD, mother undiagnosed mental health condtions   Family History:  Family History  Problem Relation Age of Onset   Cancer Mother    Migraines Father    Diabetes Father    Cerebral aneurysm Father     Social History:  Social History   Socioeconomic History   Marital status: Single    Spouse name: Not on file   Number of children: Not on file   Years of education: Not on file   Highest education level: Not on file  Occupational History   Not on file  Tobacco Use   Smoking status: Every Day    Packs/day: .5    Types: Cigarettes   Smokeless tobacco: Never  Vaping Use   Vaping Use: Every day  Substance and Sexual Activity   Alcohol use: Yes   Drug use: Not Currently   Sexual activity: Yes    Birth control/protection: Pill  Other Topics Concern   Not on file  Social History Narrative   Right handed    Social Determinants of Health   Financial Resource Strain: Not on file  Food Insecurity: Food Insecurity Present (05/18/2019)   Hunger Vital Sign    Worried About Running Out of Food in the Last Year: Sometimes true    Ran Out of Food in the Last Year: Sometimes true  Transportation Needs: Unmet Transportation Needs (05/18/2019)   PRAPARE - Hydrologist (Medical): Yes    Lack of Transportation (Non-Medical): Yes  Physical Activity: Not on file  Stress: Not on file  Social Connections: Not on file    Allergies:  Allergies  Allergen Reactions   Acetaminophen Nausea And Vomiting    And causes severe abdominal pain    Metabolic Disorder Labs: No results found for: "HGBA1C", "MPG" No results found for: "PROLACTIN" No results found for: "CHOL", "TRIG", "HDL", "CHOLHDL", "VLDL", "LDLCALC" No results  found for: "TSH"  Therapeutic Level Labs: No results found for: "LITHIUM" No results found for: "VALPROATE" No results found for: "CBMZ"  Current Medications: Current Outpatient Medications  Medication Sig Dispense Refill   buPROPion (WELLBUTRIN XL) 150 MG 24 hr tablet Take 1 tablet (150 mg total) by mouth every morning. 30 tablet 3   ARIPiprazole (ABILIFY) 10 MG tablet Take 1 tablet (10 mg total) by mouth daily. 30 tablet 3   hydrOXYzine (ATARAX) 25 MG tablet Take 1 tablet (25 mg total) by mouth 3 (three) times daily as needed. 90 tablet 3   Multiple Vitamin (MULTIVITAMIN) tablet Take 1 tablet by mouth daily.     norgestimate-ethinyl estradiol (ORTHO-CYCLEN) 0.25-35 MG-MCG tablet Take 1 tablet by mouth daily. 28 tablet 0   nortriptyline (PAMELOR) 75 MG capsule Take 1 capsule (75 mg total) by mouth at bedtime. 30 capsule 3   No current facility-administered medications for this visit.  Musculoskeletal: Strength & Muscle Tone: within normal limits and Telehealth visit Gait & Station: normal, Telehealth visit Patient leans: N/A  Psychiatric Specialty Exam: Review of Systems  unknown if currently breastfeeding.There is no height or weight on file to calculate BMI.  General Appearance: Well Groomed  Eye Contact:  Good  Speech:  Clear and Coherent and Normal Rate  Volume:  Normal  Mood:  Anxious and Depressed  Affect:  Appropriate and Congruent  Thought Process:  Coherent, Goal Directed, and Linear  Orientation:  Full (Time, Place, and Person)  Thought Content: WDL and Logical   Suicidal Thoughts:  Yes.  without intent/plan  Homicidal Thoughts:  No  Memory:  Immediate;   Good Recent;   Good Remote;   Good  Judgement:  Good  Insight:  Good  Psychomotor Activity:  Normal  Concentration:  Concentration: Fair and Attention Span: Fair  Recall:  Good  Fund of Knowledge: Good  Language: Good  Akathisia:  No  Handed:  Right  AIMS (if indicated): not done  Assets:   Communication Skills Desire for Improvement Financial Resources/Insurance Housing Physical Health Social Support  ADL's:  Intact  Cognition: WNL  Sleep:  Poor   Screenings: GAD-7    Flowsheet Row Video Visit from 02/11/2023 in [facility] Office Visit from 09/12/2022 in [facility]  Total GAD-7 Score 20 19      PHQ2-9    Flowsheet Row Video Visit from 02/11/2023 in [facility] Office Visit from 09/12/2022 in [place]  PHQ-2 Total Score 6 6  PHQ-9 Total Score 24 19      Flowsheet Row Video Visit from 02/11/2023 in [facility] ED to Hosp-Admission (Discharged) from 06/11/2022 in [place] No Risk        Assessment and Plan: Patient endorses symptoms of anxiety, depression, poor sleep, and poor concentration.Patient reports that she took two Abilify in the past and found it more effective.  Today Abilify 5 mg increased to 10 mg to help manage mood.  Patient also agreeable to starting Wellbutrin XL 150 mg daily to help manage depression, nicotine dependence, and poor concentration.  Hydroxyzine 10 mg 3 times daily also increased to 25 mg 3 times daily to help manage anxiety and sleep.   1. Severe depression (HCC)  Increased- ARIPiprazole (ABILIFY) 10 MG tablet; Take 1 tablet (10 mg total) by mouth daily.  Dispense: 30 tablet; Refill: 3 - nortriptyline (PAMELOR) 75 MG capsule; Take 1 capsule (75 mg total) by mouth at bedtime.  Dispense: 30 capsule; Refill: 3 Start- buPROPion (WELLBUTRIN XL) 150 MG 24 hr tablet; Take 1 tablet (150 mg total) by mouth every morning.  Dispense: 30 tablet; Refill: 3  2. Generalized anxiety disorder  Increased- hydrOXYzine (ATARAX) 25 MG tablet; Take 1 tablet (25 mg total) by mouth 3 (three) times daily as needed.  Dispense: 90 tablet; Refill:  3 Continue- nortriptyline (PAMELOR) 75 MG capsule; Take 1 capsule (75 mg total) by mouth at bedtime.  Dispense: 30 capsule; Refill: 3  3. PTSD (post-traumatic stress disorder)  Continue- nortriptyline (PAMELOR) 75 MG capsule; Take 1 capsule (75 mg total) by mouth at bedtime.  Dispense: 30 capsule; Refill: 3   Collaboration of Care: Collaboration of Care: Primary Care Provider AEB counselor  Patient/Guardian was advised Release of Information must be obtained prior to any record release in order  to collaborate their care with an outside provider. Patient/Guardian was advised if they have not already done so to contact the registration department to sign all necessary forms in order for [place] to release information regarding their care.   Consent: Patient/Guardian gives verbal consent for treatment and assignment of benefits for services provided during this visit. Patient/Guardian expressed understanding and agreed to proceed.    Follow-up in 3 months Follow-up with therapy  Salley Slaughter, NP 02/11/2023, 12:15 PM

## 2023-04-26 ENCOUNTER — Encounter (HOSPITAL_COMMUNITY): Payer: Self-pay | Admitting: Psychiatry

## 2023-04-26 ENCOUNTER — Telehealth (INDEPENDENT_AMBULATORY_CARE_PROVIDER_SITE_OTHER): Payer: Medicaid Other | Admitting: Psychiatry

## 2023-04-26 DIAGNOSIS — F411 Generalized anxiety disorder: Secondary | ICD-10-CM

## 2023-04-26 DIAGNOSIS — F322 Major depressive disorder, single episode, severe without psychotic features: Secondary | ICD-10-CM

## 2023-04-26 MED ORDER — ARIPIPRAZOLE 10 MG PO TABS: 10.0000 mg | ORAL_TABLET | Freq: Every day | ORAL | 3 refills | Status: DC

## 2023-04-26 MED ORDER — HYDROXYZINE HCL 25 MG PO TABS: 25.0000 mg | ORAL_TABLET | Freq: Three times a day (TID) | ORAL | 3 refills | Status: DC | PRN

## 2023-04-26 MED ORDER — BUPROPION HCL ER (XL) 150 MG PO TB24: 150.0000 mg | ORAL_TABLET | ORAL | 3 refills | Status: DC

## 2023-04-26 MED ORDER — MIRTAZAPINE 15 MG PO TABS: 15.0000 mg | ORAL_TABLET | Freq: Every day | ORAL | 2 refills | Status: DC

## 2023-04-26 NOTE — Progress Notes (Signed)
[facility] MD/PA/NP OP Progress Note Virtual Visit via Video Note  I connected with Kristina Dunn on 04/26/23 at 11:00 AM EDT by a video enabled telemedicine application and verified that I am speaking with the correct person using two identifiers.  Location: Patient: Work Provider: Clinic   I discussed the limitations of evaluation and management by telemedicine and the availability of in person appointments. The patient expressed understanding and agreed to proceed.  I provided 30 minutes of non-face-to-face time during this encounter.   04/26/2023 8:41 AM Kristina Dunn  MRN:  [id number]  Chief Complaint: "I don't think the medications are helping"  HPI: 32 year old female seen today for follow-up psychiatric evaluation.  She has a psychiatric history of Anxiety, Depression, and PTSD.  Patient is managed on nortriptyline 75 mg nightly, Wellbutrin XL 150, Abilify 10 mg daily, and hydroxyzine 25 mg 3 times daily.  She reports her medications are somewhat effective in managing her psychiatric condition.    Today she was well-groomed, pleasant, cooperative, engaged in conversation.  She informed Clinical research associate that her medications are not working. She notes that she continues to be depressed. She reports that some of her depression is situational. She reports that she and her ex continue to live together and notes that he wants her to move out of the house they purchased together. She also notes that recently she has been leaving work more and is fearful that she will lose her job. Patient notes that her 50 year old son will also be having surgery on his eye as he is partially blind. Patient notes that the above worsens her anxiety and depression. Today provider conducted a GAD-7 and patient scored a 21, at her last visit she scored a 20.  Provider also conducted PHQ-9 and patient scored a 19, at her last visit she scored a 24.  She endorses sleeping approximately 4 to 5 hours nightly. She notes her appetite is poor  and informed Clinical research associate that she has lost 5 pounds since her last visit.  She denies SI/HI/VAH, mania, paranoia.   Today Nortriptyline discontinued. Patient will start Mirtazapine 15 mg nightly to help manage sleep, anxiety, depression, and appetite. She will continue Abilify, hydroxyzine, and Wellbutrin as prescribed.  Visit Diagnosis:    ICD-10-CM   1. Severe depression (HCC)  F32.2 mirtazapine (REMERON) 15 MG tablet    ARIPiprazole (ABILIFY) 10 MG tablet    buPROPion (WELLBUTRIN XL) 150 MG 24 hr tablet    2. Generalized anxiety disorder  F41.1 mirtazapine (REMERON) 15 MG tablet    hydrOXYzine (ATARAX) 25 MG tablet      Past Psychiatric History: Anxiety, Depression, and PTSD Past Medical History:  Past Medical History:  Diagnosis Date   Medical history non-contributory     Past Surgical History:  Procedure Laterality Date   WISDOM TOOTH EXTRACTION      Family Psychiatric History: Father anxiety, depression, and PTSD, brother ADHD, mother undiagnosed mental health condtions   Family History:  Family History  Problem Relation Age of Onset   Cancer Mother    Migraines Father    Diabetes Father    Cerebral aneurysm Father     Social History:  Social History   Socioeconomic History   Marital status: Single    Spouse name: Not on file   Number of children: Not on file   Years of education: Not on file   Highest education level: Not on file  Occupational History   Not on file  Tobacco Use  Smoking status: Every Day    Packs/day: .5    Types: Cigarettes   Smokeless tobacco: Never  Vaping Use   Vaping Use: Every day  Substance and Sexual Activity   Alcohol use: Yes   Drug use: Not Currently   Sexual activity: Yes    Birth control/protection: Pill  Other Topics Concern   Not on file  Social History Narrative   Right handed    Social Determinants of Health   Financial Resource Strain: Not on file  Food Insecurity: Food Insecurity Present (05/18/2019)   Hunger  Vital Sign    Worried About Running Out of Food in the Last Year: Sometimes true    Ran Out of Food in the Last Year: Sometimes true  Transportation Needs: Unmet Transportation Needs (05/18/2019)   PRAPARE - Administrator, Civil Service (Medical): Yes    Lack of Transportation (Non-Medical): Yes  Physical Activity: Not on file  Stress: Not on file  Social Connections: Not on file    Allergies:  Allergies  Allergen Reactions   Acetaminophen Nausea And Vomiting    And causes severe abdominal pain    Metabolic Disorder Labs: No results found for: "HGBA1C", "MPG" No results found for: "PROLACTIN" No results found for: "CHOL", "TRIG", "HDL", "CHOLHDL", "VLDL", "LDLCALC" No results found for: "TSH"  Therapeutic Level Labs: No results found for: "LITHIUM" No results found for: "VALPROATE" No results found for: "CBMZ"  Current Medications: Current Outpatient Medications  Medication Sig Dispense Refill   mirtazapine (REMERON) 15 MG tablet Take 1 tablet (15 mg total) by mouth at bedtime. 30 tablet 2   ARIPiprazole (ABILIFY) 10 MG tablet Take 1 tablet (10 mg total) by mouth daily. 30 tablet 3   buPROPion (WELLBUTRIN XL) 150 MG 24 hr tablet Take 1 tablet (150 mg total) by mouth every morning. 30 tablet 3   hydrOXYzine (ATARAX) 25 MG tablet Take 1 tablet (25 mg total) by mouth 3 (three) times daily as needed. 90 tablet 3   Multiple Vitamin (MULTIVITAMIN) tablet Take 1 tablet by mouth daily.     norgestimate-ethinyl estradiol (ORTHO-CYCLEN) 0.25-35 MG-MCG tablet Take 1 tablet by mouth daily. 28 tablet 0   No current facility-administered medications for this visit.     Musculoskeletal: Strength & Muscle Tone: within normal limits and Telehealth visit Gait & Station: normal, Telehealth visit Patient leans: N/A  Psychiatric Specialty Exam: Review of Systems  unknown if currently breastfeeding.There is no height or weight on file to calculate BMI.  General Appearance:  Well Groomed  Eye Contact:  Good  Speech:  Clear and Coherent and Normal Rate  Volume:  Normal  Mood:  Anxious and Depressed  Affect:  Appropriate and Congruent  Thought Process:  Coherent, Goal Directed, and Linear  Orientation:  Full (Time, Place, and Person)  Thought Content: WDL and Logical   Suicidal Thoughts:  Yes.  without intent/plan  Homicidal Thoughts:  No  Memory:  Immediate;   Good Recent;   Good Remote;   Good  Judgement:  Good  Insight:  Good  Psychomotor Activity:  Normal  Concentration:  Concentration: Fair and Attention Span: Fair  Recall:  Good  Fund of Knowledge: Good  Language: Good  Akathisia:  No  Handed:  Right  AIMS (if indicated): not done  Assets:  Communication Skills Desire for Improvement Financial Resources/Insurance Housing Physical Health Social Support  ADL's:  Intact  Cognition: WNL  Sleep:  Poor   Screenings: GAD-7    Flowsheet  Row Video Visit from 04/26/2023 in [facility] Video Visit from 02/11/2023 in [facility] Office Visit from 09/12/2022 in [facility]  Total GAD-7 Score 21 20 19       PHQ2-9    Flowsheet Row Video Visit from 04/26/2023 in [facility] Video Visit from 02/11/2023 in [facility] Office Visit from 09/12/2022 in [facility]  PHQ-2 Total Score 6 6 6   PHQ-9 Total Score 19 24 19       Flowsheet Row Video Visit from 02/11/2023 in [facility] ED to Hosp-Admission (Discharged) from 06/11/2022 in [facility]  SURGICAL  C-SSRS RISK CATEGORY Low Risk No Risk        Assessment and Plan: Patient endorses symptoms of anxiety, depression, poor sleep, and decreased appetite. Today Nortriptyline discontinued. Patient will start Mirtazapine 15 mg nightly to help manage sleep, anxiety, depression, and  appetite. She will continue Abilify, hydroxyzine, and Wellbutrin as prescribed.   1. Severe depression (HCC)  Start- mirtazapine (REMERON) 15 MG tablet; Take 1 tablet (15 mg total) by mouth at bedtime.  Dispense: 30 tablet; Refill: 2 Continue- ARIPiprazole (ABILIFY) 10 MG tablet; Take 1 tablet (10 mg total) by mouth daily.  Dispense: 30 tablet; Refill: 3 Continue- buPROPion (WELLBUTRIN XL) 150 MG 24 hr tablet; Take 1 tablet (150 mg total) by mouth every morning.  Dispense: 30 tablet; Refill: 3  2. Generalized anxiety disorder  Start- mirtazapine (REMERON) 15 MG tablet; Take 1 tablet (15 mg total) by mouth at bedtime.  Dispense: 30 tablet; Refill: 2 Continue- hydrOXYzine (ATARAX) 25 MG tablet; Take 1 tablet (25 mg total) by mouth 3 (three) times daily as needed.  Dispense: 90 tablet; Refill: 3   Collaboration of Care: Collaboration of Care: Primary Care Provider AEB counselor  Patient/Guardian was advised Release of Information must be obtained prior to any record release in order to collaborate their care with an outside provider. Patient/Guardian was advised if they have not already done so to contact the registration department to sign all necessary forms in order for [place] to release information regarding their care.   Consent: Patient/Guardian gives verbal consent for treatment and assignment of benefits for services provided during this visit. Patient/Guardian expressed understanding and agreed to proceed.    Follow-up in  months Follow-up with therapy  Shanna Cisco, NP 04/26/2023, 8:41 AM

## 2023-07-05 ENCOUNTER — Encounter (HOSPITAL_COMMUNITY): Payer: Self-pay | Admitting: Psychiatry

## 2023-07-05 ENCOUNTER — Telehealth (HOSPITAL_COMMUNITY): Payer: Medicaid Other | Admitting: Psychiatry

## 2023-07-05 DIAGNOSIS — F322 Major depressive disorder, single episode, severe without psychotic features: Secondary | ICD-10-CM | POA: Diagnosis not present

## 2023-07-05 DIAGNOSIS — F411 Generalized anxiety disorder: Secondary | ICD-10-CM | POA: Diagnosis not present

## 2023-07-05 MED ORDER — HYDROXYZINE HCL 25 MG PO TABS: 25.0000 mg | ORAL_TABLET | Freq: Three times a day (TID) | ORAL | 3 refills | Status: DC | PRN

## 2023-07-05 MED ORDER — MIRTAZAPINE 30 MG PO TABS: 30.0000 mg | ORAL_TABLET | Freq: Every day | ORAL | 3 refills | Status: DC

## 2023-07-05 MED ORDER — ARIPIPRAZOLE 10 MG PO TABS: 10.0000 mg | ORAL_TABLET | Freq: Every day | ORAL | 3 refills | Status: DC

## 2023-07-05 MED ORDER — BUPROPION HCL ER (XL) 150 MG PO TB24: 150.0000 mg | ORAL_TABLET | ORAL | 3 refills | Status: DC
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
## 2023-07-17 ENCOUNTER — Other Ambulatory Visit (HOSPITAL_COMMUNITY): Payer: Self-pay | Admitting: Psychiatry

## 2023-07-17 DIAGNOSIS — F322 Major depressive disorder, single episode, severe without psychotic features: Secondary | ICD-10-CM

## 2023-07-17 DIAGNOSIS — F411 Generalized anxiety disorder: Secondary | ICD-10-CM
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
## 2023-09-20 ENCOUNTER — Ambulatory Visit: Payer: Medicaid Other | Admitting: Neurology

## 2023-09-20 ENCOUNTER — Encounter: Payer: Self-pay | Admitting: Neurology

## 2023-09-20 VITALS — BP 124/84 | HR 99 | Resp 18 | Ht 69.0 in | Wt 150.0 lb

## 2023-09-20 DIAGNOSIS — G43009 Migraine without aura, not intractable, without status migrainosus: Secondary | ICD-10-CM

## 2023-11-05 ENCOUNTER — Other Ambulatory Visit (HOSPITAL_COMMUNITY): Payer: Self-pay | Admitting: Psychiatry

## 2023-11-05 DIAGNOSIS — F322 Major depressive disorder, single episode, severe without psychotic features: Secondary | ICD-10-CM

## 2023-11-05 DIAGNOSIS — F411 Generalized anxiety disorder: Secondary | ICD-10-CM

## 2023-11-15 ENCOUNTER — Other Ambulatory Visit (HOSPITAL_COMMUNITY): Payer: Self-pay | Admitting: Psychiatry

## 2023-11-15 DIAGNOSIS — F322 Major depressive disorder, single episode, severe without psychotic features: Secondary | ICD-10-CM

## 2023-11-15 DIAGNOSIS — F411 Generalized anxiety disorder: Secondary | ICD-10-CM

## 2024-01-27 ENCOUNTER — Other Ambulatory Visit (HOSPITAL_COMMUNITY): Payer: Self-pay | Admitting: Psychiatry

## 2024-01-27 DIAGNOSIS — F411 Generalized anxiety disorder: Secondary | ICD-10-CM

## 2024-01-27 DIAGNOSIS — F322 Major depressive disorder, single episode, severe without psychotic features: Secondary | ICD-10-CM

## 2024-02-06 ENCOUNTER — Telehealth (HOSPITAL_COMMUNITY): Admitting: Psychiatry

## 2024-02-06 ENCOUNTER — Encounter (HOSPITAL_COMMUNITY): Payer: Self-pay | Admitting: Psychiatry

## 2024-02-06 DIAGNOSIS — F411 Generalized anxiety disorder: Secondary | ICD-10-CM

## 2024-02-06 DIAGNOSIS — F322 Major depressive disorder, single episode, severe without psychotic features: Secondary | ICD-10-CM | POA: Diagnosis not present

## 2024-02-06 MED ORDER — HYDROXYZINE HCL 25 MG PO TABS: 25.0000 mg | ORAL_TABLET | Freq: Three times a day (TID) | ORAL | 3 refills | Status: DC | PRN

## 2024-02-06 MED ORDER — BUPROPION HCL ER (XL) 150 MG PO TB24: 150.0000 mg | ORAL_TABLET | ORAL | 3 refills | Status: DC

## 2024-02-06 MED ORDER — MIRTAZAPINE 30 MG PO TABS: 30.0000 mg | ORAL_TABLET | Freq: Every day | ORAL | 3 refills | Status: DC

## 2024-02-06 MED ORDER — ARIPIPRAZOLE 10 MG PO TABS: 10.0000 mg | ORAL_TABLET | Freq: Every day | ORAL | 3 refills | Status: DC

## 2024-02-06 NOTE — Progress Notes (Signed)
 BH MD/PA/NP OP Progress Note Virtual Visit via Video Note  I connected with Kristina Dunn on 02/06/24 at  3:00 PM EDT by a video enabled telemedicine application and verified that I am speaking with the correct person using two identifiers.  Location: Patient: Work Provider: Clinic   I discussed the limitations of evaluation and management by telemedicine and the availability of in person appointments. The patient expressed understanding and agreed to proceed.  I provided 30 minutes of non-face-to-face time during this encounter.   02/06/2024 3:16 PM Kristina Dunn  MRN:  [id number]  Chief Complaint: "I have ran out of my medications"  HPI: 33 year old female seen today for follow-up psychiatric evaluation.  She has a psychiatric history of Anxiety, Depression, and PTSD.  Patient is managed on mirtazapine 30 mg nightly, Wellbutrin XL 150, Abilify 10 mg daily, and hydroxyzine 25 mg 3 times daily.  She reports that she has been out of mirtazapine since late December/early January and notes that her medications are somewhat effective in managing her psychiatric condition.    Today she was well-groomed, pleasant, cooperative, engaged in conversation.  She informed Clinical research associate that she ran out of Mirtazapine. She notes that it has impacting her mood, anxiety, depression, and sleep.  Patient reports that she is more anxious.  She informed Clinical research associate that she at times worried about finances.  Patient reports that her son did well with his eye surgery last year.  She notes that he had no complication and will be following up with his provider soon.  She is no longer living with her ex-boyfriend but notes that she and her son has a small apartment of their own.  Patient notes that her sleep is poor reporting that she sleeps 3 to 4 hours nightly.    Today provider conducted a GAD-7 and she scored a 16, at her last visit please go to 15.  Provide also conducted PHQ-9 and she scored a 17, at her last visit she scored a  10.  She endorses adequate appetite.  Today she denies SI/HI/VAH, mania, paranoia.  Today mirtazapine 30 mg restarted to help manage sleep, anxiety and depression.  She will continue all other medications as prescribed. No other concerns at this time.  Visit Diagnosis:    ICD-10-CM   1. Severe depression (HCC)  F32.2 ARIPiprazole (ABILIFY) 10 MG tablet    buPROPion (WELLBUTRIN XL) 150 MG 24 hr tablet    mirtazapine (REMERON) 30 MG tablet    2. Generalized anxiety disorder  F41.1 hydrOXYzine (ATARAX) 25 MG tablet    mirtazapine (REMERON) 30 MG tablet        Past Psychiatric History: Anxiety, Depression, and PTSD Past Medical History:  Past Medical History:  Diagnosis Date   Medical history non-contributory     Past Surgical History:  Procedure Laterality Date   WISDOM TOOTH EXTRACTION      Family Psychiatric History: Father anxiety, depression, and PTSD, brother ADHD, mother undiagnosed mental health condtions   Family History:  Family History  Problem Relation Age of Onset   Cancer Mother    Migraines Father    Diabetes Father    Cerebral aneurysm Father     Social History:  Social History   Socioeconomic History   Marital status: Single    Spouse name: Not on file   Number of children: Not on file   Years of education: Not on file   Highest education level: Not on file  Occupational History   Not on file  Tobacco Use   Smoking status: Every Day    Current packs/day: 0.50    Types: Cigarettes   Smokeless tobacco: Never  Vaping Use   Vaping status: Every Day  Substance and Sexual Activity   Alcohol use: Yes   Drug use: Not Currently   Sexual activity: Yes    Birth control/protection: Pill  Other Topics Concern   Not on file  Social History Narrative   Right handed    Social Drivers of Health   Financial Resource Strain: Not on file  Food Insecurity: Food Insecurity Present (05/18/2019)   Hunger Vital Sign    Worried About Running Out of Food in  the Last Year: Sometimes true    Ran Out of Food in the Last Year: Sometimes true  Transportation Needs: Unmet Transportation Needs (05/18/2019)   PRAPARE - Administrator, Civil Service (Medical): Yes    Lack of Transportation (Non-Medical): Yes  Physical Activity: Not on file  Stress: Not on file  Social Connections: Unknown (04/10/2022)   [facility] from [facility]   Social Network    Social Network: Not on file    Allergies:  Allergies  Allergen Reactions   Acetaminophen Nausea And Vomiting    And causes severe abdominal pain    Metabolic Disorder Labs: No results found for: "HGBA1C", "MPG" No results found for: "PROLACTIN" No results found for: "CHOL", "TRIG", "HDL", "CHOLHDL", "VLDL", "LDLCALC" No results found for: "TSH"  Therapeutic Level Labs: No results found for: "LITHIUM" No results found for: "VALPROATE" No results found for: "CBMZ"  Current Medications: Current Outpatient Medications  Medication Sig Dispense Refill   ARIPiprazole (ABILIFY) 10 MG tablet Take 1 tablet (10 mg total) by mouth daily. 30 tablet 3   buPROPion (WELLBUTRIN XL) 150 MG 24 hr tablet Take 1 tablet (150 mg total) by mouth every morning. 30 tablet 3   hydrOXYzine (ATARAX) 25 MG tablet Take 1 tablet (25 mg total) by mouth 3 (three) times daily as needed. 90 tablet 3   mirtazapine (REMERON) 30 MG tablet Take 1 tablet (30 mg total) by mouth at bedtime. 30 tablet 3   Multiple Vitamin (MULTIVITAMIN) tablet Take 1 tablet by mouth daily.     norgestimate-ethinyl estradiol (ORTHO-CYCLEN) 0.25-35 MG-MCG tablet Take 1 tablet by mouth daily. 28 tablet 0   No current facility-administered medications for this visit.     Musculoskeletal: Strength & Muscle Tone: within normal limits and Telehealth visit Gait & Station: normal, Telehealth visit Patient leans: N/A  Psychiatric Specialty Exam: Review of Systems  unknown if currently breastfeeding.There is no height or  weight on file to calculate BMI.  General Appearance: Well Groomed  Eye Contact:  Good  Speech:  Clear and Coherent and Normal Rate  Volume:  Normal  Mood:  Anxious and Depressed   Affect:  Appropriate and Congruent  Thought Process:  Coherent, Goal Directed, and Linear  Orientation:  Full (Time, Place, and Person)  Thought Content: WDL and Logical   Suicidal Thoughts:  No  Homicidal Thoughts:  No  Memory:  Immediate;   Good Recent;   Good Remote;   Good  Judgement:  Good  Insight:  Good  Psychomotor Activity:  Normal  Concentration:  Concentration: Good and Attention Span: Good  Recall:  Good  Fund of Knowledge: Good  Language: Good  Akathisia:  No  Handed:  Right  AIMS (if indicated): not done  Assets:  Communication Skills Desire for Improvement Financial Resources/Insurance Housing Physical  Health Social Support  ADL's:  Intact  Cognition: WNL  Sleep:  Poor   Screenings: GAD-7    Flowsheet Row Video Visit from 02/06/2024 in [facility] Video Visit from 07/05/2023 in [facility] Video Visit from 04/26/2023 in [facility] Video Visit from 02/11/2023 in [facility] Office Visit from 09/12/2022 in [facility]  Total GAD-7 Score 16 15 21 20 19       PHQ2-9    Flowsheet Row Video Visit from 02/06/2024 in [facility] Video Visit from 07/05/2023 in [facility] Video Visit from 04/26/2023 in [facility] Video Visit from 02/11/2023 in [facility] Office Visit from 09/12/2022 in [facility]  PHQ-2 Total Score 4 2 6 6 6   PHQ-9 Total Score 17 10 19 24 19       Flowsheet Row Video Visit from 02/11/2023 in [facility] ED to Hosp-Admission (Discharged) from 06/11/2022 in [facility]  SURGICAL  C-SSRS RISK CATEGORY Low Risk No Risk        Assessment and Plan: Patient endorses symptoms of anxiety, depression, and poor sleep. Today mirtazapine 30 mg restarted to help manage sleep, anxiety and depression.  She will continue all other medications as prescribed.  1. Severe depression (HCC)  Restart.- mirtazapine (REMERON) 30 MG tablet; Take 1 tablet (30 mg total) by mouth at bedtime.  Dispense: 30 tablet; Refill: 3 Continue- buPROPion (WELLBUTRIN XL) 150 MG 24 hr tablet; Take 1 tablet (150 mg total) by mouth every morning.  Dispense: 30 tablet; Refill: 3 Continue- ARIPiprazole (ABILIFY) 10 MG tablet; Take 1 tablet (10 mg total) by mouth daily.  Dispense: 30 tablet; Refill: 3  2. Generalized anxiety disorder  Restart- mirtazapine (REMERON) 30 MG tablet; Take 1 tablet (30 mg total) by mouth at bedtime.  Dispense: 30 tablet; Refill: 3 Continue- hydrOXYzine (ATARAX) 25 MG tablet; Take 1 tablet (25 mg total) by mouth 3 (three) times daily as needed.  Dispense: 90 tablet; Refill: 3    Collaboration of Care: Collaboration of Care: Primary Care Provider AEB counselor  Patient/Guardian was advised Release of Information must be obtained prior to any record release in order to collaborate their care with an outside provider. Patient/Guardian was advised if they have not already done so to contact the registration department to sign all necessary forms in order for [place] to release information regarding their care.   Consent: Patient/Guardian gives verbal consent for treatment and assignment of benefits for services provided during this visit. Patient/Guardian expressed understanding and agreed to proceed.    Follow-up in 2.5 months Follow-up with therapy  Shanna Cisco, NP 02/06/2024, 3:16 PM

## 2024-04-10 ENCOUNTER — Encounter (HOSPITAL_COMMUNITY): Payer: Self-pay | Admitting: Psychiatry

## 2024-04-10 ENCOUNTER — Telehealth (HOSPITAL_COMMUNITY): Admitting: Psychiatry

## 2024-04-10 DIAGNOSIS — F3181 Bipolar II disorder: Secondary | ICD-10-CM | POA: Diagnosis not present

## 2024-04-10 DIAGNOSIS — F411 Generalized anxiety disorder: Secondary | ICD-10-CM | POA: Diagnosis not present

## 2024-04-10 MED ORDER — HYDROXYZINE HCL 50 MG PO TABS: 50.0000 mg | ORAL_TABLET | Freq: Three times a day (TID) | ORAL | 3 refills | Status: DC | PRN

## 2024-04-10 MED ORDER — ARIPIPRAZOLE 15 MG PO TABS: 15.0000 mg | ORAL_TABLET | Freq: Every day | ORAL | 3 refills | Status: DC

## 2024-04-10 MED ORDER — MIRTAZAPINE 30 MG PO TABS: 30.0000 mg | ORAL_TABLET | Freq: Every day | ORAL | 3 refills | Status: DC

## 2024-04-10 MED ORDER — LAMOTRIGINE 25 MG PO TABS: 25.0000 mg | ORAL_TABLET | Freq: Every day | ORAL | 2 refills | Status: DC

## 2024-04-10 NOTE — Progress Notes (Signed)
 BH MD/PA/NP OP Progress Note Virtual Visit via Video Note  I connected with Kristina Dunn on 04/10/24 at 11:30 AM EDT by a video enabled telemedicine application and verified that I am speaking with the correct person using two identifiers.  Location: Patient: Work Provider: Clinic   I discussed the limitations of evaluation and management by telemedicine and the availability of in person appointments. The patient expressed understanding and agreed to proceed.  I provided 30 minutes of non-face-to-face time during this encounter.   04/10/2024 11:02 AM Kristina Dunn  MRN:  [id number]  Chief Complaint: "I feel that my medications are not working"  HPI: 33 year old female seen today for follow-up psychiatric evaluation.  She has a psychiatric history of Anxiety, Depression, and PTSD.  Patient is managed on mirtazapine  30 mg nightly, Wellbutrin  XL 150, Abilify  10 mg daily, and hydroxyzine  25 mg 3 times daily.  She reports that her medications are somewhat effective in managing her psychiatric condition.    Today she was well-groomed, pleasant, cooperative, engaged in conversation.  She informed Clinical research associate that she feels like her medications are not working.  She informed Clinical research associate that she recently did an online screening that confirmed the diagnosis of anxiety and depression.  She notes that it also indicated that she may need further assessment for bipolar disorder.  Patient informed Clinical research associate that her mother was diagnosed with bipolar disorder a few years ago.  Provider did reassess patient for bipolar disorder.  She describes being irritable, distracted, having racing thoughts, and fluctuations in mood.  At times she notes that she impulsively spends her money on buying unnecessary plants.  She also informed writer that at times she has excessively high energy and then later on she becomes extremely depressed/fatigued.  She notes that her depressive state is not improving with antidepressants.  Provider  informed patient that she does present with some symptoms of bipolar disorder.   Since her last visit she reports her anxiety and depression continues to be problematic. Today provider conducted a GAD-7 and she scored a 20, at her last visit please go to 16.  Provide also conducted PHQ-9 and she scored a 21, at her last visit she scored a 17.  She endorses adequate appetite.  Patient notes that her sleep is poor (4-5 hours).  Today she denies SI/HI/VAH, mania, paranoia.  Today patient agreeable to discontinuing Wellbutrin .  Provider started Lamictal 25 mg to be taken for 2 weeks.  She will then increase to 50 mg for 2 weeks.  Patient will then increase to 75 mg.  Abilify  10 mg increased to 15 mg to help manage mood and depressive symptoms.  Hydroxyzine  25 mg 3 times daily increased to 50 mg 3 times daily to manage anxiety.  She will continue her other medications as prescribed.  Potential side effects of medication and risks vs benefits of treatment vs non-treatment were explained and discussed. All questions were answered.  No other concerns at this time.  Visit Diagnosis:    ICD-10-CM   1. Bipolar 2 disorder, major depressive episode (HCC)  F31.81 ARIPiprazole  (ABILIFY ) 15 MG tablet    lamoTRIgine (LAMICTAL) 25 MG tablet    2. Generalized anxiety disorder  F41.1 hydrOXYzine  (ATARAX ) 50 MG tablet    mirtazapine  (REMERON ) 30 MG tablet         Past Psychiatric History: Anxiety, Depression, and PTSD Past Medical History:  Past Medical History:  Diagnosis Date   Medical history non-contributory     Past Surgical History:  Procedure  Laterality Date   WISDOM TOOTH EXTRACTION      Family Psychiatric History: Father anxiety, depression, and PTSD, brother ADHD, mother undiagnosed mental health condtions   Family History:  Family History  Problem Relation Age of Onset   Cancer Mother    Migraines Father    Diabetes Father    Cerebral aneurysm Father     Social History:  Social  History   Socioeconomic History   Marital status: Single    Spouse name: Not on file   Number of children: Not on file   Years of education: Not on file   Highest education level: Not on file  Occupational History   Not on file  Tobacco Use   Smoking status: Every Day    Current packs/day: 0.50    Types: Cigarettes   Smokeless tobacco: Never  Vaping Use   Vaping status: Every Day  Substance and Sexual Activity   Alcohol use: Yes   Drug use: Not Currently   Sexual activity: Yes    Birth control/protection: Pill  Other Topics Concern   Not on file  Social History Narrative   Right handed    Social Drivers of Health   Financial Resource Strain: Not on file  Food Insecurity: Food Insecurity Present (05/18/2019)   Hunger Vital Sign    Worried About Running Out of Food in the Last Year: Sometimes true    Ran Out of Food in the Last Year: Sometimes true  Transportation Needs: Unmet Transportation Needs (05/18/2019)   PRAPARE - Administrator, Civil Service (Medical): Yes    Lack of Transportation (Non-Medical): Yes  Physical Activity: Not on file  Stress: Not on file  Social Connections: Unknown (04/10/2022)   Received from [facility], [facility]   Social Network    Social Network: Not on file    Allergies:  Allergies  Allergen Reactions   Acetaminophen  Nausea And Vomiting    And causes severe abdominal pain    Metabolic Disorder Labs: No results found for: "HGBA1C", "MPG" No results found for: "PROLACTIN" No results found for: "CHOL", "TRIG", "HDL", "CHOLHDL", "VLDL", "LDLCALC" No results found for: "TSH"  Therapeutic Level Labs: No results found for: "LITHIUM" No results found for: "VALPROATE" No results found for: "CBMZ"  Current Medications: Current Outpatient Medications  Medication Sig Dispense Refill   lamoTRIgine (LAMICTAL) 25 MG tablet Take 1 tablet (25 mg total) by mouth daily. Take one tablet (25 mg) for two week, Increase to (50  mg) two tablets on week 3, then increase to 3 tablets (75 mg) on week 5. Follow up in a month for further adjustments 30 tablet 2   ARIPiprazole  (ABILIFY ) 15 MG tablet Take 1 tablet (15 mg total) by mouth daily. 30 tablet 3   hydrOXYzine  (ATARAX ) 50 MG tablet Take 1 tablet (50 mg total) by mouth 3 (three) times daily as needed. 90 tablet 3   mirtazapine  (REMERON ) 30 MG tablet Take 1 tablet (30 mg total) by mouth at bedtime. 30 tablet 3   Multiple Vitamin (MULTIVITAMIN) tablet Take 1 tablet by mouth daily.     norgestimate -ethinyl estradiol  (ORTHO-CYCLEN) 0.25-35 MG-MCG tablet Take 1 tablet by mouth daily. 28 tablet 0   No current facility-administered medications for this visit.     Musculoskeletal: Strength & Muscle Tone: within normal limits and Telehealth visit Gait & Station: normal, Telehealth visit Patient leans: N/A  Psychiatric Specialty Exam: Review of Systems  unknown if currently breastfeeding.There is no height or weight  on file to calculate BMI.  General Appearance: Well Groomed  Eye Contact:  Good  Speech:  Clear and Coherent and Normal Rate  Volume:  Normal  Mood:  Anxious and Depressed   Affect:  Appropriate and Congruent  Thought Process:  Coherent, Goal Directed, and Linear  Orientation:  Full (Time, Place, and Person)  Thought Content: WDL and Logical   Suicidal Thoughts:  No  Homicidal Thoughts:  No  Memory:  Immediate;   Good Recent;   Good Remote;   Good  Judgement:  Good  Insight:  Good  Psychomotor Activity:  Normal  Concentration:  Concentration: Good and Attention Span: Good  Recall:  Good  Fund of Knowledge: Good  Language: Good  Akathisia:  No  Handed:  Right  AIMS (if indicated): not done  Assets:  Communication Skills Desire for Improvement Financial Resources/Insurance Housing Physical Health Social Support  ADL's:  Intact  Cognition: WNL  Sleep:  Poor   Screenings: GAD-7    Flowsheet Row Video Visit from 04/10/2024 in [facility] Video Visit from 02/06/2024 in [facility] Video Visit from 07/05/2023 in [facility] Video Visit from 04/26/2023 in [facility] Video Visit from 02/11/2023 in [facility]  Total GAD-7 Score 20 16 15 21 20       PHQ2-9    Flowsheet Row Video Visit from 04/10/2024 in [facility] Video Visit from 02/06/2024 in [facility] Video Visit from 07/05/2023 in [facility] Video Visit from 04/26/2023 in [facility] Video Visit from 02/11/2023 in [facility]  PHQ-2 Total Score 6 4 2 6 6   PHQ-9 Total Score 21 17 10 19 24       Flowsheet Row Video Visit from 02/11/2023 in [facility] ED to Hosp-Admission (Discharged) from 06/11/2022 in  [facility]  SURGICAL  C-SSRS RISK CATEGORY Low Risk No Risk        Assessment and Plan: Patient endorses symptoms of hypomania, anxiety, depression, and poor sleep. Today patient agreeable to discontinuing Wellbutrin .  Provider started Lamictal 25 mg to be taken for 2 weeks.  She will then increase to 50 mg for 2 weeks.  Patient will then increase to 75 mg.  Abilify  10 mg increased to 15 mg to help manage mood and depressive symptoms.  Hydroxyzine  25 mg 3 times daily increased to 50 mg 3 times daily to manage anxiety.  She will continue her other medications as prescribed. 1. Generalized anxiety disorder  Increased- hydrOXYzine  (ATARAX ) 50 MG tablet; Take 1 tablet (50 mg total) by mouth 3 (three) times daily as needed.  Dispense: 90 tablet; Refill: 3 Continue- mirtazapine  (REMERON ) 30 MG tablet; Take 1 tablet (30 mg total) by mouth at bedtime.  Dispense: 30 tablet; Refill: 3  2. Bipolar 2 disorder, major depressive episode (HCC)  (Primary)  Increased- ARIPiprazole  (ABILIFY ) 15 MG tablet; Take 1 tablet (15 mg total) by mouth daily.  Dispense: 30 tablet; Refill: 3 Start- lamoTRIgine (LAMICTAL) 25 MG tablet; Take 1 tablet (25 mg total) by mouth daily. Take one tablet (25 mg) for two week, Increase to (50 mg) two tablets on week 3, then increase to 3 tablets (75 mg) on week 5. Follow up in a month for further adjustments  Dispense: 30 tablet; Refill: 2   Collaboration of Care: Collaboration of Care:  Primary Care Provider AEB counselor  Patient/Guardian was advised Release of Information must be obtained prior to any record release in order to collaborate their care with an outside provider. Patient/Guardian was advised if they have not already done so to contact the registration department to sign all necessary forms in order for us  to release information regarding their care.   Consent: Patient/Guardian gives verbal consent for treatment and assignment of benefits for services provided during this visit. Patient/Guardian expressed understanding and agreed to proceed.    Follow-up in 2.5 months Follow-up with therapy  Arlyne Bering, NP 04/10/2024, 11:02 AM

## 2024-05-04 ENCOUNTER — Encounter (HOSPITAL_COMMUNITY): Payer: Self-pay

## 2024-05-08 ENCOUNTER — Telehealth (HOSPITAL_COMMUNITY): Admitting: Psychiatry

## 2024-05-08 ENCOUNTER — Encounter (HOSPITAL_COMMUNITY): Payer: Self-pay

## 2024-07-31 ENCOUNTER — Other Ambulatory Visit (HOSPITAL_COMMUNITY): Payer: Self-pay | Admitting: Psychiatry

## 2024-07-31 DIAGNOSIS — F3181 Bipolar II disorder: Secondary | ICD-10-CM

## 2024-08-18 ENCOUNTER — Telehealth (HOSPITAL_COMMUNITY): Payer: Self-pay | Admitting: Psychiatry

## 2024-08-24 NOTE — Telephone Encounter (Signed)
 Provider attempted to call patient twice without success.  Voicemail left for patient to schedule an appointment or call back.

## 2024-08-28 ENCOUNTER — Encounter (HOSPITAL_COMMUNITY): Payer: Self-pay | Admitting: Psychiatry

## 2024-08-28 ENCOUNTER — Telehealth (HOSPITAL_COMMUNITY): Admitting: Psychiatry

## 2024-08-28 DIAGNOSIS — F411 Generalized anxiety disorder: Secondary | ICD-10-CM

## 2024-08-28 DIAGNOSIS — F3181 Bipolar II disorder: Secondary | ICD-10-CM

## 2024-08-28 MED ORDER — ARIPIPRAZOLE 15 MG PO TABS: 15.0000 mg | ORAL_TABLET | Freq: Every day | ORAL | 3 refills | Status: DC

## 2024-08-28 MED ORDER — MIRTAZAPINE 45 MG PO TABS: 45.0000 mg | ORAL_TABLET | Freq: Every day | ORAL | 3 refills | Status: DC

## 2024-08-28 MED ORDER — HYDROXYZINE HCL 50 MG PO TABS: 50.0000 mg | ORAL_TABLET | Freq: Three times a day (TID) | ORAL | 3 refills | Status: DC | PRN

## 2024-08-28 NOTE — Progress Notes (Signed)
 BH MD/PA/NP OP Progress Note Virtual Visit via Video Note  I connected with Kristina Dunn on 08/28/24 at 10:00 AM EDT by a video enabled telemedicine application and verified that I am speaking with the correct person using two identifiers.  Location: Patient: Work Provider: Clinic   I discussed the limitations of evaluation and management by telemedicine and the availability of in person appointments. The patient expressed understanding and agreed to proceed.  I provided 30 minutes of non-face-to-face time during this encounter.   08/28/2024 10:33 AM Kristina Dunn  MRN:  [id number]  Chief Complaint: I am irritable without the Abilify   HPI: 33 year old female seen today for follow-up psychiatric evaluation.  She has a psychiatric history of Anxiety, Depression, bipolar 2, and PTSD.  Patient is managed on mirtazapine  30 mg nightly, , Abilify  15 mg daily, Lamictal  100 mg, and hydroxyzine  50 mg 3 times daily.  She reports that her medications are somewhat effective in managing her psychiatric condition.  She reports that she stopped Lamictal  as it caused irregular menstrual cycles.  Today she was well-groomed, pleasant, cooperative, engaged in conversation.  She informed Clinical research associate that she feels irritable without Abilify .  She informed Clinical research associate that she has been without it for a month.  She also notes that she also is distracted. She notes that at times she impulsively spends money on plants. She notes that she has over 150 plants in her home. At times she notes that she impulsively spends money on clothing.   Patient notes that she continues to use online screening and now notes that she feels that she believes she has ADHD.  She was screened screened for ADHD.  [facility]   Since her last visit she notes that she has been more anxious and depressed.  Provider conducted a GAD-7 and patient scored a 18.  Provider also conducted PHQ-9 and patient scored a 17.   She endorses adequate sleep and appetite.  Today she denies SI/HI/VAH for paranoia.  Today Abilify  15 mg restarted.  Provider encouraged patient to take half a pill for a week and then increase to 15 mg to help manage mood.  Mirtazapine  30 mg increased to 15 mg to help with his anxiety and depression.  Patient will follow-up with [facility] for ADHD testing.  At this time she does not wish to continue Lamictal .   No other concerns at this time.  Visit Diagnosis:    ICD-10-CM   1. Bipolar 2 disorder, major depressive episode (HCC)  F31.81 ARIPiprazole  (ABILIFY ) 15 MG tablet    2. Generalized anxiety disorder  F41.1 mirtazapine  (REMERON ) 45 MG tablet    hydrOXYzine  (ATARAX ) 50 MG tablet          Past Psychiatric History: Anxiety, Depression, and PTSD Past Medical History:  Past Medical History:  Diagnosis Date   Medical history non-contributory     Past Surgical History:  Procedure Laterality Date   WISDOM TOOTH EXTRACTION      Family Psychiatric History: Father anxiety, depression, and PTSD, brother ADHD, mother undiagnosed mental health condtions   Family History:  Family History  Problem Relation Age of Onset   Cancer Mother    Migraines Father    Diabetes Father    Cerebral aneurysm Father     Social History:  Social History   Socioeconomic History   Marital status: Single    Spouse name: Not on file   Number of children: Not on file   Years of education: Not  on file   Highest education level: Not on file  Occupational History   Not on file  Tobacco Use   Smoking status: Every Day    Current packs/day: 0.50    Types: Cigarettes   Smokeless tobacco: Never  Vaping Use   Vaping status: Every Day  Substance and Sexual Activity   Alcohol use: Yes   Drug use: Not Currently   Sexual activity: Yes    Birth control/protection: Pill  Other Topics Concern   Not on file  Social History Narrative   Right handed    Social Drivers of Health   Financial  Resource Strain: Not on file  Food Insecurity: Food Insecurity Present (05/18/2019)   Hunger Vital Sign    Worried About Running Out of Food in the Last Year: Sometimes true    Ran Out of Food in the Last Year: Sometimes true  Transportation Needs: Unmet Transportation Needs (05/18/2019)   PRAPARE - Administrator, Civil Service (Medical): Yes    Lack of Transportation (Non-Medical): Yes  Physical Activity: Not on file  Stress: Not on file  Social Connections: Unknown (04/10/2022)   Received from [facility]   Social Network    Social Network: Not on file    Allergies:  Allergies  Allergen Reactions   Acetaminophen  Nausea And Vomiting    And causes severe abdominal pain    Metabolic Disorder Labs: No results found for: HGBA1C, MPG No results found for: PROLACTIN No results found for: CHOL, TRIG, HDL, CHOLHDL, VLDL, LDLCALC No results found for: TSH  Therapeutic Level Labs: No results found for: LITHIUM No results found for: VALPROATE No results found for: CBMZ  Current Medications: Current Outpatient Medications  Medication Sig Dispense Refill   ARIPiprazole  (ABILIFY ) 15 MG tablet Take 1 tablet (15 mg total) by mouth daily. 30 tablet 3   hydrOXYzine  (ATARAX ) 50 MG tablet Take 1 tablet (50 mg total) by mouth 3 (three) times daily as needed. 90 tablet 3   mirtazapine  (REMERON ) 45 MG tablet Take 1 tablet (45 mg total) by mouth at bedtime. 30 tablet 3   Multiple Vitamin (MULTIVITAMIN) tablet Take 1 tablet by mouth daily.     norgestimate -ethinyl estradiol  (ORTHO-CYCLEN) 0.25-35 MG-MCG tablet Take 1 tablet by mouth daily. 28 tablet 0   No current facility-administered medications for this visit.     Musculoskeletal: Strength & Muscle Tone: within normal limits and Telehealth visit Gait & Station: normal, Telehealth visit Patient leans: N/A  Psychiatric Specialty Exam: Review of Systems  unknown if currently breastfeeding.There is  no height or weight on file to calculate BMI.  General Appearance: Well Groomed  Eye Contact:  Good  Speech:  Clear and Coherent and Normal Rate  Volume:  Normal  Mood:  Anxious and Depressed   Affect:  Appropriate and Congruent  Thought Process:  Coherent, Goal Directed, and Linear  Orientation:  Full (Time, Place, and Person)  Thought Content: WDL and Logical   Suicidal Thoughts:  No  Homicidal Thoughts:  No  Memory:  Immediate;   Good Recent;   Good Remote;   Good  Judgement:  Good  Insight:  Good  Psychomotor Activity:  Normal  Concentration:  Concentration: Good and Attention Span: Good  Recall:  Good  Fund of Knowledge: Good  Language: Good  Akathisia:  No  Handed:  Right  AIMS (if indicated): not done  Assets:  Communication Skills Desire for Improvement Financial Resources/Insurance Housing Physical Health Social Support  ADL's:  Intact  Cognition: WNL  Sleep:  Poor   Screenings: GAD-7    Flowsheet Row Video Visit from 08/28/2024 in [facility] Video Visit from 04/10/2024 in [facility] Video Visit from 02/06/2024 in [facility] Video Visit from 07/05/2023 in [facility] Video Visit from 04/26/2023 in [facility]  Total GAD-7 Score 18 20 16 15 21    PHQ2-9    Flowsheet Row Video Visit from 08/28/2024 in [facility] Video Visit from 04/10/2024 in [facility] Video Visit from 02/06/2024 in [facility] Video Visit from 07/05/2023 in [id number] Video Visit from 04/26/2023 in [facility]  PHQ-2 Total Score 5 6 4 2 6   PHQ-9 Total Score 17 21 17 10 19    Flowsheet Row Video Visit from 02/11/2023 in [facility] ED to Hosp-Admission (Discharged) from 06/11/2022 in [facility]  SURGICAL  C-SSRS RISK CATEGORY Low Risk No Risk     Assessment and Plan: Patient endorses symptoms of hypomania, anxiety, depression, and poor concentration. Today Abilify  15 mg restarted.  Provider encouraged patient to take half a pill for a week and then increase to 15 mg to help manage mood.  Mirtazapine  30 mg increased to 15 mg to help with his anxiety and depression.  Patient will [facility].  At this time she does not wish to continue Lamictal .   1. Bipolar 2 disorder, major depressive episode (HCC)  Restart- ARIPiprazole  (ABILIFY ) 15 MG tablet; Take 1 tablet (15 mg total) by mouth daily.  Dispense: 30 tablet; Refill: 3  2. Generalized anxiety disorder  Increased- mirtazapine  (REMERON ) 45 MG tablet; Take 1 tablet (45 mg total) by mouth at bedtime.  Dispense: 30 tablet; Refill: 3 Continue- hydrOXYzine  (ATARAX ) 50 MG tablet; Take 1 tablet (50 mg total) by mouth 3 (three) times daily as needed.  Dispense: 90 tablet; Refill: 3  Collaboration of Care: Collaboration of Care: Primary Care Provider AEB counselor  Patient/Guardian was advised Release of Information must be obtained prior to any record release in order to collaborate their care with an outside provider. Patient/Guardian was advised if they have not already done so to contact the registration department to sign all necessary forms in order for us  to release information regarding their care.   Consent: Patient/Guardian gives verbal consent for treatment and assignment of benefits for services provided during this visit. Patient/Guardian expressed understanding and agreed to proceed.    Follow-up in 2 months Follow-up with therapy  Zane FORBES Bach, NP 08/28/2024, 10:33 AM

## 2024-10-30 ENCOUNTER — Encounter (HOSPITAL_COMMUNITY): Payer: Self-pay | Admitting: Psychiatry

## 2024-10-30 ENCOUNTER — Telehealth (INDEPENDENT_AMBULATORY_CARE_PROVIDER_SITE_OTHER): Admitting: Psychiatry

## 2024-10-30 DIAGNOSIS — F3181 Bipolar II disorder: Secondary | ICD-10-CM

## 2024-10-30 DIAGNOSIS — F411 Generalized anxiety disorder: Secondary | ICD-10-CM

## 2024-10-30 MED ORDER — ARIPIPRAZOLE 15 MG PO TABS: 15.0000 mg | ORAL_TABLET | Freq: Every day | ORAL | 3 refills | Status: DC

## 2024-10-30 MED ORDER — MIRTAZAPINE 45 MG PO TABS: 45.0000 mg | ORAL_TABLET | Freq: Every day | ORAL | 3 refills | Status: DC

## 2024-10-30 MED ORDER — HYDROXYZINE HCL 50 MG PO TABS: 50.0000 mg | ORAL_TABLET | Freq: Three times a day (TID) | ORAL | 3 refills | Status: DC | PRN

## 2024-10-30 NOTE — Progress Notes (Signed)
 BH MD/PA/NP OP Progress Note Virtual Visit via Video Note  I connected with Kristina Dunn on 10/30/24 at 11:00 AM EST by a video enabled telemedicine application and verified that I am speaking with the correct person using two identifiers.  Location: Patient: Work Provider: Clinic   I discussed the limitations of evaluation and management by telemedicine and the availability of in person appointments. The patient expressed understanding and agreed to proceed.  I provided 30 minutes of non-face-to-face time during this encounter.   10/30/2024 11:24 AM Kristina Dunn  MRN:  [id number]  Chief Complaint: I have an appoint with a psychiatrist to address my ADHD today  HPI: 33 year old female seen today for follow-up psychiatric evaluation.  She has a psychiatric history of Anxiety, Depression, bipolar 2, and PTSD.  Patient is managed on mirtazapine  45 mg nightly, , Abilify  15 mg daily, and hydroxyzine  50 mg 3 times daily.  She reports that her medications are somewhat effective in managing her psychiatric condition.   Today she was well-groomed, pleasant, cooperative, engaged in conversation.  She informed clinical research associate that she has an appointment at [facility] medical today to see a psychiatrist.  She notes that she is hopeful that they will be able to treat her ADHD.  Writer referred patient to [facility] however she notes that she never received a call back.  She also notes that her PCP referred her to [facility] but notes that they did not take her insurance.  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  Provider also discussed trialing Strattera or Intuniv in the future to help manage inattentiveness.    Since her last visit she notes that she reports that her anxiety and depression are better managed.  Today provider conducted a GAD-7 and patient scored a 16, at her last visit  she scored a 18.  Provider also conducted PHQ-9 and patient scored a 15, at her last visit she scored a 17.  She endorses adequate sleep and appetite.  Today she denies SI/HI/VAH for paranoia.  No medication changes made today.  Patient agreeable to taking medication as prescribed.  Patient notes that she will be following up with [facility] medical for potential assistance with her inattentiveness/potential ADHD.  No other concerns at this time.  Visit Diagnosis:    ICD-10-CM   1. Bipolar 2 disorder, major depressive episode (HCC)  F31.81 ARIPiprazole  (ABILIFY ) 15 MG tablet    2. Generalized anxiety disorder  F41.1 mirtazapine  (REMERON ) 45 MG tablet    hydrOXYzine  (ATARAX ) 50 MG tablet           Past Psychiatric History: Anxiety, Depression, and PTSD Past Medical History:  Past Medical History:  Diagnosis Date   Medical history non-contributory     Past Surgical History:  Procedure Laterality Date   WISDOM TOOTH EXTRACTION      Family Psychiatric History: Father anxiety, depression, and PTSD, brother ADHD, mother undiagnosed mental health condtions   Family History:  Family History  Problem Relation Age of Onset   Cancer Mother    Migraines Father    Diabetes Father    Cerebral aneurysm Father     Social History:  Social History   Socioeconomic History   Marital status: Single    Spouse name: Not on file   Number of children: Not on file   Years of education: Not on file   Highest education level: Not on file  Occupational History   Not on file  Tobacco Use   Smoking status: Every Day    Current packs/day: 0.50    Types: Cigarettes   Smokeless tobacco: Never  Vaping Use   Vaping status: Every Day  Substance and Sexual Activity   Alcohol use: Yes   Drug use: Not Currently   Sexual activity: Yes    Birth control/protection: Pill  Other Topics Concern   Not on file  Social History Narrative   Right handed    Social Drivers of Health   Financial  Resource Strain: Not on file  Food Insecurity: Food Insecurity Present (05/18/2019)   Hunger Vital Sign    Worried About Running Out of Food in the Last Year: Sometimes true    Ran Out of Food in the Last Year: Sometimes true  Transportation Needs: Unmet Transportation Needs (05/18/2019)   PRAPARE - Administrator, Civil Service (Medical): Yes    Lack of Transportation (Non-Medical): Yes  Physical Activity: Not on file  Stress: Not on file  Social Connections: Unknown (04/10/2022)   Received from [facility]   Social Network    Social Network: Not on file    Allergies:  Allergies  Allergen Reactions   Acetaminophen  Nausea And Vomiting    And causes severe abdominal pain    Metabolic Disorder Labs: No results found for: HGBA1C, MPG No results found for: PROLACTIN No results found for: CHOL, TRIG, HDL, CHOLHDL, VLDL, LDLCALC No results found for: TSH  Therapeutic Level Labs: No results found for: LITHIUM No results found for: VALPROATE No results found for: CBMZ  Current Medications: Current Outpatient Medications  Medication Sig Dispense Refill   ARIPiprazole  (ABILIFY ) 15 MG tablet Take 1 tablet (15 mg total) by mouth daily. 30 tablet 3   hydrOXYzine  (ATARAX ) 50 MG tablet Take 1 tablet (50 mg total) by mouth 3 (three) times daily as needed. 90 tablet 3   mirtazapine  (REMERON ) 45 MG tablet Take 1 tablet (45 mg total) by mouth at bedtime. 30 tablet 3   Multiple Vitamin (MULTIVITAMIN) tablet Take 1 tablet by mouth daily.     norgestimate -ethinyl estradiol  (ORTHO-CYCLEN) 0.25-35 MG-MCG tablet Take 1 tablet by mouth daily. 28 tablet 0   No current facility-administered medications for this visit.     Musculoskeletal: Strength & Muscle Tone: within normal limits and Telehealth visit Gait & Station: normal, Telehealth visit Patient leans: N/A  Psychiatric Specialty Exam: Review of Systems  HENT:  Negative for drooling.     unknown  if currently breastfeeding.There is no height or weight on file to calculate BMI.  General Appearance: Well Groomed  Eye Contact:  Good  Speech:  Clear and Coherent and Normal Rate  Volume:  Normal  Mood:  Anxious and Depressed, improving  Affect:  Appropriate and Congruent  Thought Process:  Coherent, Goal Directed, and Linear  Orientation:  Full (Time, Place, and Person)  Thought Content: WDL and Logical   Suicidal Thoughts:  No  Homicidal Thoughts:  No  Memory:  Immediate;   Good Recent;   Good Remote;   Good  Judgement:  Good  Insight:  Good  Psychomotor Activity:  Normal  Concentration:  Concentration: Fair and Attention Span: Good  Recall:  Good  Fund of Knowledge: Good  Language: Good  Akathisia:  No  Handed:  Right  AIMS (if indicated): not done  Assets:  Communication Skills Desire for Improvement Financial Resources/Insurance Housing Physical Health Social Support  ADL's:  Intact  Cognition: WNL  Sleep:  Good   Screenings: GAD-7    Flowsheet Row Video Visit from 10/30/2024 in [facility] Video Visit from 08/28/2024 in [facility] Video Visit from 04/10/2024 in [facility] Video Visit from 02/06/2024 in [facility] Video Visit from 07/05/2023 in [facility]  Total GAD-7 Score 16 18 20 16 15    PHQ2-9    Flowsheet Row Video Visit from 10/30/2024 in [facility] Video Visit from 08/28/2024 in [facility] Video Visit from 04/10/2024 in [facility] Video Visit from 02/06/2024 in [facility] Video Visit from 07/05/2023 in [facility]  PHQ-2 Total Score 4 5 6 4 2   PHQ-9 Total Score 15 17 21 17 10    Flowsheet Row Video Visit from 02/11/2023 in [facility] ED to  Hosp-Admission (Discharged) from 06/11/2022 in [facility]  SURGICAL  C-SSRS RISK CATEGORY Low Risk No Risk     Assessment and Plan: Patient reports that her anxiety and depression has somewhat improved.  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  She has an appointment with [facility] medical today to address her potential ADHD.  At this time no medication changes made.  Patient agreeable to continue medication as prescribed.  Patient informed that if her inattentiveness is not addressed she can be referred to [facility] as she has been unsuccessful getting in with [facility].  Provider also discussed trialing Intuniv or Strattera.  1. Bipolar 2 disorder, major depressive episode (HCC)  Continue- ARIPiprazole  (ABILIFY ) 15 MG tablet; Take 1 tablet (15 mg total) by mouth daily.  Dispense: 30 tablet; Refill: 3  2. Generalized anxiety disorder  Increased- mirtazapine  (REMERON ) 45 MG tablet; Take 1 tablet (45 mg total) by mouth at bedtime.  Dispense: 30 tablet; Refill: 3 Continue- hydrOXYzine  (ATARAX ) 50 MG tablet; Take 1 tablet (50 mg total) by mouth 3 (three) times daily as needed.  Dispense: 90 tablet; Refill: 3  Collaboration of Care: Collaboration of Care: Primary Care Provider AEB counselor  Patient/Guardian was advised Release of Information must be obtained prior to any record release in order to collaborate their care with an outside provider. Patient/Guardian was advised if they have not already done so to contact the registration department to sign all necessary forms in order for us  to release information regarding their care.   Consent: Patient/Guardian gives verbal consent for treatment and assignment of benefits for services provided during this visit. Patient/Guardian expressed understanding and agreed to proceed.    Follow-up in 2 months Follow-up with therapy  Zane FORBES Bach, NP 10/30/2024, 11:24 AM

## 2025-01-01 ENCOUNTER — Encounter (HOSPITAL_COMMUNITY): Payer: Self-pay | Admitting: Psychiatry

## 2025-01-01 ENCOUNTER — Telehealth (HOSPITAL_COMMUNITY): Admitting: Psychiatry

## 2025-01-01 DIAGNOSIS — F411 Generalized anxiety disorder: Secondary | ICD-10-CM

## 2025-01-01 DIAGNOSIS — F3181 Bipolar II disorder: Secondary | ICD-10-CM

## 2025-01-01 MED ORDER — HYDROXYZINE HCL 50 MG PO TABS: 50.0000 mg | ORAL_TABLET | Freq: Three times a day (TID) | ORAL | 3 refills | Status: AC | PRN

## 2025-01-01 MED ORDER — MIRTAZAPINE 45 MG PO TABS: 45.0000 mg | ORAL_TABLET | Freq: Every day | ORAL | 3 refills | Status: AC

## 2025-01-01 MED ORDER — GABAPENTIN 100 MG PO CAPS: 100.0000 mg | ORAL_CAPSULE | Freq: Three times a day (TID) | ORAL | 3 refills | Status: AC

## 2025-01-01 MED ORDER — ARIPIPRAZOLE 15 MG PO TABS: 15.0000 mg | ORAL_TABLET | Freq: Every day | ORAL | 3 refills | Status: AC

## 2025-01-01 NOTE — Progress Notes (Signed)
 BH MD/PA/NP OP Progress Note Virtual Visit via Video Note  I connected with Kristina Dunn on 01/01/25 at 10:00 AM EST by a video enabled telemedicine application and verified that I am speaking with the correct person using two identifiers.  Location: Patient: Work Provider: Clinic   I discussed the limitations of evaluation and management by telemedicine and the availability of in person appointments. The patient expressed understanding and agreed to proceed.  I provided 30 minutes of non-face-to-face time during this encounter.   01/01/2025 10:23 AM Kristina Dunn  MRN:  [id number]  Chief Complaint: I have a lot of stress  HPI: 34 year old female seen today for follow-up psychiatric evaluation.  She has a psychiatric history of Anxiety, Depression, bipolar 2, and PTSD.  Patient is managed on mirtazapine  45 mg nightly, Abilify  15 mg daily, and hydroxyzine  50 mg 3 times daily.  She is also prescribed Intuniv 4 mg (received from [facility] ).  She reports that her medications are somewhat effective in managing her psychiatric condition.   Today she was well-groomed, pleasant, cooperative, engaged in conversation.  She informed clinical research associate that she is under a lot of stress. She notes that her boyfriend and her are going though relationship issues. She also notes that her rent has increased and she is considering buying a camper. Patient continues to see another mental health provider at [facility].  She reports that she did [facility] testing and was informed that she should not be on Abilify  but instead of Caplyta.  Provider informed patient that Caplyta is a good medication to treat bipolar disorder.  Patient notes that she would like to call her insurance prior to stopping Abilify  to make sure Caplyta is covered.  She also notes that she would like to discuss this with her other mental health provider.  Provider endorse understanding and encouraged patient to decide between writer for her  provider at [facility].  She endorsed understanding and notes that she has an upcoming appointment on Friday and will make a decision then.  Provider endorse understanding and agreed.   Patient notes that the above exacerbates her anxiety and depression.  Today provider conducted GAD-7 and patient scoredn 18, at her last visit she scored a 16.  Provider also conducted PHQ-9 and patient scored a 18, at her last visit she scored a 15.  She endorses adequate sleep and appetite.  Today she denies SI/HI/VAH for paranoia.  Patient reports that she has still not had psychological testing.  Writer referred her to [facility] and her PCP referred her to [facility].  She informed clinical research associate that her new mental health provider placed her on Intuniv 4 mg but reports that she finds it ineffective.  She continues to be forgetful, disorganized, has poor listening skills, and is unable to focus on mentally taxing tasks.  Provider informed patient that Strattera may also be beneficial.  He notes that she will discuss this on Friday at her behavioral health appointment.    Today patient agreeable to starting gabapentin  100 mg 3 times daily to help manage anxiety and mood.  She will continue all other medications as prescribed.  No other concerns at this time.  Visit Diagnosis:    ICD-10-CM   1. Generalized anxiety disorder  F41.1 gabapentin  (NEURONTIN ) 100 MG capsule    hydrOXYzine  (ATARAX ) 50 MG tablet    mirtazapine  (REMERON ) 45 MG tablet    2. Bipolar 2 disorder, major depressive episode (HCC)  F31.81 gabapentin  (NEURONTIN ) 100 MG capsule  ARIPiprazole  (ABILIFY ) 15 MG tablet            Past Psychiatric History: Anxiety, Depression, and PTSD Past Medical History:  Past Medical History:  Diagnosis Date   Medical history non-contributory     Past Surgical History:  Procedure Laterality Date   WISDOM TOOTH EXTRACTION      Family Psychiatric History: Father anxiety,  depression, and PTSD, brother ADHD, mother undiagnosed mental health condtions   Family History:  Family History  Problem Relation Age of Onset   Cancer Mother    Migraines Father    Diabetes Father    Cerebral aneurysm Father     Social History:  Social History   Socioeconomic History   Marital status: Single    Spouse name: Not on file   Number of children: Not on file   Years of education: Not on file   Highest education level: Not on file  Occupational History   Not on file  Tobacco Use   Smoking status: Every Day    Current packs/day: 0.50    Types: Cigarettes   Smokeless tobacco: Never  Vaping Use   Vaping status: Every Day  Substance and Sexual Activity   Alcohol use: Yes   Drug use: Not Currently   Sexual activity: Yes    Birth control/protection: Pill  Other Topics Concern   Not on file  Social History Narrative   Right handed    Social Drivers of Health   Tobacco Use: High Risk (10/30/2024)   Patient History    Smoking Tobacco Use: Every Day    Smokeless Tobacco Use: Never    Passive Exposure: Not on file  Financial Resource Strain: Not on file  Food Insecurity: Not on file  Transportation Needs: Not on file  Physical Activity: Not on file  Stress: Not on file  Social Connections: Unknown (04/10/2022)   Received from [facility]   Social Network    Social Network: Not on file  Depression (PHQ2-9): High Risk (01/01/2025)   Depression (PHQ2-9)    PHQ-2 Score: 18  Alcohol Screen: Not on file  Housing: Not on file  Utilities: Not on file  Health Literacy: Not on file    Allergies:  Allergies  Allergen Reactions   Acetaminophen  Nausea And Vomiting    And causes severe abdominal pain    Metabolic Disorder Labs: No results found for: HGBA1C, MPG No results found for: PROLACTIN No results found for: CHOL, TRIG, HDL, CHOLHDL, VLDL, LDLCALC No results found for: TSH  Therapeutic Level Labs: No results found for:  LITHIUM No results found for: VALPROATE No results found for: CBMZ  Current Medications: Current Outpatient Medications  Medication Sig Dispense Refill   gabapentin  (NEURONTIN ) 100 MG capsule Take 1 capsule (100 mg total) by mouth 3 (three) times daily. 90 capsule 3   ARIPiprazole  (ABILIFY ) 15 MG tablet Take 1 tablet (15 mg total) by mouth daily. 30 tablet 3   hydrOXYzine  (ATARAX ) 50 MG tablet Take 1 tablet (50 mg total) by mouth 3 (three) times daily as needed. 90 tablet 3   mirtazapine  (REMERON ) 45 MG tablet Take 1 tablet (45 mg total) by mouth at bedtime. 30 tablet 3   Multiple Vitamin (MULTIVITAMIN) tablet Take 1 tablet by mouth daily.     norgestimate -ethinyl estradiol  (ORTHO-CYCLEN) 0.25-35 MG-MCG tablet Take 1 tablet by mouth daily. 28 tablet 0   No current facility-administered medications for this visit.     Musculoskeletal: Strength & Muscle Tone: within normal limits and  Telehealth visit Gait & Station: normal, Telehealth visit Patient leans: N/A  Psychiatric Specialty Exam: Review of Systems  unknown if currently breastfeeding.There is no height or weight on file to calculate BMI.  General Appearance: Well Groomed  Eye Contact:  Good  Speech:  Clear and Coherent and Normal Rate  Volume:  Normal  Mood:  Anxious and Depressed, improving  Affect:  Appropriate and Congruent  Thought Process:  Coherent, Goal Directed, and Linear  Orientation:  Full (Time, Place, and Person)  Thought Content: WDL and Logical   Suicidal Thoughts:  No  Homicidal Thoughts:  No  Memory:  Immediate;   Good Recent;   Good Remote;   Good  Judgement:  Good  Insight:  Good  Psychomotor Activity:  Normal  Concentration:  Concentration: Fair and Attention Span: Good  Recall:  Good  Fund of Knowledge: Good  Language: Good  Akathisia:  No  Handed:  Right  AIMS (if indicated): not done  Assets:  Communication Skills Desire for Improvement Financial  Resources/Insurance Housing Physical Health Social Support  ADL's:  Intact  Cognition: WNL  Sleep:  Good   Screenings: GAD-7    Flowsheet Row Video Visit from 01/01/2025 in [facility] Video Visit from 10/30/2024 in [facility] Video Visit from 08/28/2024 in [facility] Video Visit from 04/10/2024 in [facility] Video Visit from 02/06/2024 in [facility]  Total GAD-7 Score 18 16 18 20 16    PHQ2-9    Flowsheet Row Video Visit from 01/01/2025 in [facility] Video Visit from 10/30/2024 in [facility] Video Visit from 08/28/2024 in [facility] Video Visit from 04/10/2024 in [facility] Video Visit from 02/06/2024 in [facility]  PHQ-2 Total Score 5 4 5 6 4   PHQ-9 Total Score 18 15 17 21 17    Flowsheet Row Video Visit from 02/11/2023 in [facility] ED to Hosp-Admission (Discharged) from 06/11/2022 in [facility]  SURGICAL  C-SSRS RISK CATEGORY Low Risk No Risk     Assessment and Plan: Patient reports that her anxiety and depression continues to be problematic.  She is also concerned about psychological testing as she is waiting for an appointment.  Patient is seen at [facility] by another psychiatric provider.  She was started on Intuniv but finds it ineffective.  Provider informed patient that Strattera could be trialed in the future.  She notes that she will discuss this with her provider at [facility].  Patient also reports that she was told that she should stop Abilify  and trial Caplyta.  Prior to doing it she noted he wanted to call reports that he if Caplyta is covered.  Today patient agreeable to starting gabapentin  100 mg 3 times daily to help  manage anxiety and mood.  She will continue all other medications as prescribed.  1. Generalized anxiety disorder  Start- gabapentin  (NEURONTIN ) 100 MG capsule; Take 1 capsule (100 mg total) by mouth 3 (three) times daily.  Dispense: 90 capsule; Refill: 3 Continue- hydrOXYzine  (ATARAX ) 50 MG tablet; Take 1 tablet (50 mg total) by mouth 3 (three) times daily as needed.  Dispense: 90 tablet; Refill: 3 Continue- mirtazapine  (REMERON ) 45 MG tablet; Take 1 tablet (45 mg total) by mouth at bedtime.  Dispense: 30 tablet; Refill: 3  2. Bipolar 2 disorder,  major depressive episode (HCC)  Start- gabapentin  (NEURONTIN ) 100 MG capsule; Take 1 capsule (100 mg total) by mouth 3 (three) times daily.  Dispense: 90 capsule; Refill: 3 Continue- ARIPiprazole  (ABILIFY ) 15 MG tablet; Take 1 tablet (15 mg total) by mouth daily.  Dispense: 30 tablet; Refill: 3   Collaboration of Care: Collaboration of Care: Primary Care Provider AEB counselor  Patient/Guardian was advised Release of Information must be obtained prior to any record release in order to collaborate their care with an outside provider. Patient/Guardian was advised if they have not already done so to contact the registration department to sign all necessary forms in order for us  to release information regarding their care.   Consent: Patient/Guardian gives verbal consent for treatment and assignment of benefits for services provided during this visit. Patient/Guardian expressed understanding and agreed to proceed.    Follow-up in 2 months Follow-up with therapy  Zane FORBES Bach, NP 01/01/2025, 10:23 AM
